# Patient Record
Sex: Male | Born: 1995 | Race: Black or African American | Hispanic: No | Marital: Single | Smoking: Never smoker
Health system: Southern US, Community
[De-identification: ages and names within clinical notes are randomized; demographics above are authoritative.]

---
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
## 2015-02-08 ENCOUNTER — Encounter: Payer: Self-pay | Admitting: *Deleted

## 2015-02-08 ENCOUNTER — Emergency Department
Admission: EM | Admit: 2015-02-08 | Discharge: 2015-02-08 | Disposition: A | Discharge status: Home / Self Care | Payer: BLUE CROSS/BLUE SHIELD | Attending: Emergency Medicine | Admitting: Emergency Medicine

## 2015-02-08 DIAGNOSIS — A084 Viral intestinal infection, unspecified: Secondary | ICD-10-CM | POA: Diagnosis not present

## 2015-02-08 DIAGNOSIS — R109 Unspecified abdominal pain: Secondary | ICD-10-CM | POA: Diagnosis present

## 2015-02-08 LAB — CBC WITH DIFFERENTIAL/PLATELET
BASOS ABS: 0 10*3/uL (ref 0–0.1)
Basophils Relative: 0 %
EOS PCT: 0 %
Eosinophils Absolute: 0 10*3/uL (ref 0–0.7)
HEMATOCRIT: 47.5 % (ref 40.0–52.0)
Hemoglobin: 15.9 g/dL (ref 13.0–18.0)
LYMPHS ABS: 0.4 10*3/uL — AB (ref 1.0–3.6)
LYMPHS PCT: 3 %
MCH: 27.1 pg (ref 26.0–34.0)
MCHC: 33.6 g/dL (ref 32.0–36.0)
MCV: 80.7 fL (ref 80.0–100.0)
MONO ABS: 0.3 10*3/uL (ref 0.2–1.0)
Monocytes Relative: 2 %
NEUTROS ABS: 13.4 10*3/uL — AB (ref 1.4–6.5)
Neutrophils Relative %: 95 %
PLATELETS: 262 10*3/uL (ref 150–440)
RBC: 5.88 MIL/uL (ref 4.40–5.90)
RDW: 13.8 % (ref 11.5–14.5)
WBC: 14.2 10*3/uL — ABNORMAL HIGH (ref 3.8–10.6)

## 2015-02-08 LAB — BASIC METABOLIC PANEL
ANION GAP: 10 (ref 5–15)
BUN: 12 mg/dL (ref 6–20)
CO2: 25 mmol/L (ref 22–32)
Calcium: 9.7 mg/dL (ref 8.9–10.3)
Chloride: 104 mmol/L (ref 101–111)
Creatinine, Ser: 0.77 mg/dL (ref 0.61–1.24)
GFR calc Af Amer: >60 mL/min (ref >60)
GFR calc non Af Amer: >60 mL/min (ref >60)
GLUCOSE: 101 mg/dL — AB (ref 65–99)
POTASSIUM: 4 mmol/L (ref 3.5–5.1)
Sodium: 139 mmol/L (ref 135–145)

## 2015-02-08 LAB — LIPASE, BLOOD: LIPASE: 18 U/L (ref 11–51)

## 2015-02-08 LAB — HEPATIC FUNCTION PANEL
ALBUMIN: 4.8 g/dL (ref 3.5–5.0)
ALT: 22 U/L (ref 17–63)
AST: 19 U/L (ref 15–41)
Alkaline Phosphatase: 81 U/L (ref 38–126)
BILIRUBIN TOTAL: 0.6 mg/dL (ref 0.3–1.2)
Bilirubin, Direct: <0.1 mg/dL — ABNORMAL LOW (ref 0.1–0.5)
Total Protein: 8.2 g/dL — ABNORMAL HIGH (ref 6.5–8.1)

## 2015-02-08 LAB — URINALYSIS COMPLETE WITH MICROSCOPIC (ARMC ONLY)
BACTERIA UA: NONE SEEN
BILIRUBIN URINE: NEGATIVE
Glucose, UA: NEGATIVE mg/dL
HGB URINE DIPSTICK: NEGATIVE
LEUKOCYTES UA: NEGATIVE
Nitrite: NEGATIVE
PH: 7 (ref 5.0–8.0)
PROTEIN: NEGATIVE mg/dL
RBC / HPF: NONE SEEN RBC/hpf (ref 0–5)
SQUAMOUS EPITHELIAL / LPF: NONE SEEN
Specific Gravity, Urine: 1.027 (ref 1.005–1.030)

## 2015-02-08 MED ORDER — ONDANSETRON HCL 4 MG/2ML IJ SOLN
4.0000 mg | Freq: Once | INTRAMUSCULAR | Status: AC
  Administered 2015-02-08: 4 mg via INTRAVENOUS
  Filled 2015-02-08: qty 2

## 2015-02-08 MED ORDER — SODIUM CHLORIDE 0.9 % IV BOLUS (SEPSIS)
1000.0000 mL | INTRAVENOUS | Status: AC
  Administered 2015-02-08: 1000 mL via INTRAVENOUS

## 2015-02-08 MED ORDER — ONDANSETRON HCL 4 MG PO TABS
ORAL_TABLET | ORAL | Status: DC
Start: 2015-02-08 — End: 2019-07-22

## 2015-02-08 NOTE — ED Notes (Signed)
Pt was provided ginger ale per request for PO Challenge.

## 2015-02-08 NOTE — ED Notes (Addendum)
Pt states vomited 2x today with the 1st occurrence containing dark red blood and numerous occurrences of diarrhea. Pt states he has had some lightheadedness.

## 2015-02-08 NOTE — Discharge Instructions (Signed)
We believe your symptoms are caused by either a viral infection or possible a bad food exposure.  Either way, since your symptoms have improved, we feel it is safe for you to go home and follow up with your regular doctor.  Please read the included information and stick to a bland diet for the next two days.  Drink plenty of clear fluids, and if you were provided with a prescription, please take it according to the label instructions.   ° °If you develop any new or worsening symptoms, including persistent vomiting not controlled with medication, fever greater than 101, severe or worsening abdominal pain, or other symptoms that concern you, please return immediately to the Emergency Department. ° °Viral Gastroenteritis  °Viral gastroenteritis is also known as stomach flu. This condition affects the stomach and intestinal tract. It can cause sudden diarrhea and vomiting. The illness typically lasts 3 to 8 days. Most people develop an immune response that eventually gets rid of the virus. While this natural response develops, the virus can make you quite ill.  °CAUSES  °Many different viruses can cause gastroenteritis, such as rotavirus or noroviruses. You can catch one of these viruses by consuming contaminated food or water. You may also catch a virus by sharing utensils or other personal items with an infected person or by touching a contaminated surface.  °SYMPTOMS  °The most common symptoms are diarrhea and vomiting. These problems can cause a severe loss of body fluids (dehydration) and a body salt (electrolyte) imbalance. Other symptoms may include:  °Fever.  °Headache.  °Fatigue.  °Abdominal pain. °DIAGNOSIS  °Your caregiver can usually diagnose viral gastroenteritis based on your symptoms and a physical exam. A stool sample may also be taken to test for the presence of viruses or other infections.  °TREATMENT  °This illness typically goes away on its own. Treatments are aimed at rehydration. The most serious  cases of viral gastroenteritis involve vomiting so severely that you are not able to keep fluids down. In these cases, fluids must be given through an intravenous line (IV).  °HOME CARE INSTRUCTIONS  °Drink enough fluids to keep your urine clear or pale yellow. Drink small amounts of fluids frequently and increase the amounts as tolerated.  °Ask your caregiver for specific rehydration instructions.  °Avoid:  °Foods high in sugar.  °Alcohol.  °Carbonated drinks.  °Tobacco.  °Juice.  °Caffeine drinks.  °Extremely hot or cold fluids.  °Fatty, greasy foods.  °Too much intake of anything at one time.  °Dairy products until 24 to 48 hours after diarrhea stops. °You may consume probiotics. Probiotics are active cultures of beneficial bacteria. They may lessen the amount and number of diarrheal stools in adults. Probiotics can be found in yogurt with active cultures and in supplements.  °Wash your hands well to avoid spreading the virus.  °Only take over-the-counter or prescription medicines for pain, discomfort, or fever as directed by your caregiver. Do not give aspirin to children. Antidiarrheal medicines are not recommended.  °Ask your caregiver if you should continue to take your regular prescribed and over-the-counter medicines.  °Keep all follow-up appointments as directed by your caregiver. °SEEK IMMEDIATE MEDICAL CARE IF:  °You are unable to keep fluids down.  °You do not urinate at least once every 6 to 8 hours.  °You develop shortness of breath.  °You notice blood in your stool or vomit. This may look like coffee grounds.  °You have abdominal pain that increases or is concentrated in one small area (  localized).  °You have persistent vomiting or diarrhea.  °You have a fever.  °The patient is a child younger than 3 months, and he or she has a fever.  °The patient is a child older than 3 months, and he or she has a fever and persistent symptoms.  °The patient is a child older than 3 months, and he or she has a fever  and symptoms suddenly get worse.  °The patient is a baby, and he or she has no tears when crying. °MAKE SURE YOU:  °Understand these instructions.  °Will watch your condition.  °Will get help right away if you are not doing well or get worse. °This information is not intended to replace advice given to you by your health care provider. Make sure you discuss any questions you have with your health care provider.  °Document Released: 01/15/2005 Document Revised: 04/09/2011 Document Reviewed: 11/01/2010  °Elsevier Interactive Patient Education ©2016 [employer]  ° °

## 2015-02-08 NOTE — ED Notes (Signed)
Pt reports abd pain since this am.  Pt reports seeing blood in emesis today x 2.  Diarrhea greater than 5 times.  No blood seen in stools. Pt alert.

## 2015-02-08 NOTE — ED Provider Notes (Signed)
[facility] Emergency Department Provider Note  ____________________________________________  Time seen: Approximately 6:03 PM  I have reviewed the triage vital signs and the nursing notes.   HISTORY  Chief Complaint Abdominal Pain    HPI Douglas Moore is a 20 y.o. male with a past medical history significant only for childhood asthma which is since resolved to presents with numerous episodes of vomiting (at least 8-10 times) and multiple episodes of loose stool today.  It started acutely this morning and has been persistent.  He notes that 2 different times he has seen dark red blood within the emesis.  He has felt lightheaded after the numerous symptoms of vomiting and diarrhea.  He is complaining of generalized abdominal pain without any specific focus or point origination.  He denies fever/chills, difficulty breathing, chest pain.Scrub the symptoms as severe earlier but he feels better now than he did before.  He has had some lightheadedness.   No past medical history on file.  There are no active problems to display for this patient.   No past surgical history on file.  Current Outpatient Rx  Name  Route  Sig  Dispense  Refill  . albuterol (PROVENTIL HFA;VENTOLIN HFA) 108 (90 BASE) MCG/ACT inhaler   Inhalation   Inhale 2 puffs into the lungs every 6 (six) hours as needed. For shortness of breath.          Marland Kitchen. albuterol (PROVENTIL) (2.5 MG/3ML) 0.083% nebulizer solution   Nebulization   Take 2.5 mg by nebulization every 6 (six) hours as needed. For shortness of breath.          . ondansetron (ZOFRAN) 4 MG tablet      Take 1-2 tabs by mouth every 8 hours as needed for nausea/vomiting   30 tablet   0     Allergies Review of patient's allergies indicates no known allergies.  No family history on file.  Social History Social History  Substance Use Topics  . Smoking status: Never Smoker   . Smokeless tobacco: Not on file  .  Alcohol Use: Yes    Review of Systems Constitutional: No fever/chills Eyes: No visual changes. ENT: No sore throat. Cardiovascular: Denies chest pain. Respiratory: Denies shortness of breath. Gastrointestinal: Generalized abdominal pain with numerous episodes of vomiting and multiple episodes of diarrhea.  There was some hematemesis.  He did not observe any blood in his stool. Genitourinary: Negative for dysuria. Musculoskeletal: Negative for back pain. Skin: Negative for rash. Neurological: Negative for headaches, focal weakness or numbness.  10-point ROS otherwise negative.  ____________________________________________   PHYSICAL EXAM:  VITAL SIGNS: ED Triage Vitals  Enc Vitals Group     BP 02/08/15 1544 133/72 mmHg     Pulse Rate 02/08/15 1544 96     Resp 02/08/15 1544 20     Temp 02/08/15 1544 97.5 F (36.4 C)     Temp Source 02/08/15 1544 Oral     SpO2 02/08/15 1544 100 %     Weight 02/08/15 1544 189 lb (85.73 kg)     Height 02/08/15 1544 5\' 10"  (1.778 m)     Head Cir --      Peak Flow --      Pain Score 02/08/15 1550 8     Pain Loc --      Pain Edu? --      Excl. in GC? --     Constitutional: Alert and oriented. Well appearing and in no acute distress. Eyes: Conjunctivae  are normal. PERRL. EOMI. Head: Atraumatic. Nose: No congestion/rhinnorhea. Mouth/Throat: Mucous membranes are moist.  Oropharynx non-erythematous. Neck: No stridor.   Cardiovascular: Normal rate, regular rhythm. Grossly normal heart sounds.  Good peripheral circulation. Respiratory: Normal respiratory effort.  No retractions. Lungs CTAB. Gastrointestinal: Soft with generalized mild tenderness to palpation throughout his abdomen.  No specific tenderness at McBurney's point, negative Murphy's sign. Musculoskeletal: No lower extremity tenderness nor edema.  No joint effusions. Neurologic:  Normal speech and language. No gross focal neurologic deficits are appreciated.  Skin:  Skin is warm, dry  and intact. No rash noted.   ____________________________________________   LABS (all labs ordered are listed, but only abnormal results are displayed)  Labs Reviewed  CBC WITH DIFFERENTIAL/PLATELET - Abnormal; Notable for the following:    WBC 14.2 (*)    Neutro Abs 13.4 (*)    Lymphs Abs 0.4 (*)    All other components within normal limits  BASIC METABOLIC PANEL - Abnormal; Notable for the following:    Glucose, Bld 101 (*)    All other components within normal limits  HEPATIC FUNCTION PANEL - Abnormal; Notable for the following:    Total Protein 8.2 (*)    Bilirubin, Direct <0.1 (*)    All other components within normal limits  LIPASE, BLOOD  URINALYSIS COMPLETEWITH MICROSCOPIC ([facility] ONLY)   ____________________________________________  EKG  Not indicated ____________________________________________  RADIOLOGY   No results found.  ____________________________________________   PROCEDURES  Procedure(s) performed: None  Critical Care performed: No ____________________________________________   INITIAL IMPRESSION / ASSESSMENT AND PLAN / ED COURSE  Pertinent labs & imaging results that were available during my care of the patient were reviewed by me and considered in my medical decision making (see chart for details).  The patient is very well-appearing at this time, laughing and joking with me, in no acute distress, and has normal vital signs.  His heart rate is likely elevated from his baseline since he is a young and healthy male in general and he has some volume depletion from what is most likely a viral gastroenteritis.  I will give him 1 L of normal saline as well as some additional Zofran and do a by mouth challenge.  His abdominal tenderness is very mild and I do not believe it represents an intra-abdominal abnormality such as appendicitis or other acute emergency.  I discussed all this with the patient and his mother and we are going to proceed with IV  fluids, Zofran and reassessment.  ----------------------------------------- 7:54 PM on 02/08/2015 -----------------------------------------  Reassessed, patient was sleeping and resting comfortably.  He has some residual aching in his stomach but overall he feels much better.  He is tolerated ginger ale and not had any additional vomiting episodes and no additional diarrhea.  I reexamined his abdomen and he has no tenderness to palpation, no guarding, no rebound tenderness.  His mild leukocytosis of believe is attributable to a viral gastroenteritis.  I gave my usual and customary return precautions.    ____________________________________________  FINAL CLINICAL IMPRESSION(S) / ED DIAGNOSES  Final diagnoses:  Viral gastroenteritis      NEW MEDICATIONS STARTED DURING THIS VISIT:  New Prescriptions   ONDANSETRON (ZOFRAN) 4 MG TABLET    Take 1-2 tabs by mouth every 8 hours as needed for nausea/vomiting     Modified Medications   No medications on file     Discontinued Medications   No medications on file     Loleta Rose, MD 02/08/15 4098

## 2015-06-18 ENCOUNTER — Emergency Department
Admission: EM | Admit: 2015-06-18 | Discharge: 2015-06-18 | Disposition: A | Discharge status: Home / Self Care | Payer: BLUE CROSS/BLUE SHIELD | Attending: Emergency Medicine | Admitting: Emergency Medicine

## 2015-06-18 ENCOUNTER — Encounter: Payer: Self-pay | Admitting: Emergency Medicine

## 2015-06-18 DIAGNOSIS — Y9367 Activity, basketball: Secondary | ICD-10-CM | POA: Insufficient documentation

## 2015-06-18 DIAGNOSIS — Y999 Unspecified external cause status: Secondary | ICD-10-CM | POA: Diagnosis not present

## 2015-06-18 DIAGNOSIS — W2105XA Struck by basketball, initial encounter: Secondary | ICD-10-CM | POA: Insufficient documentation

## 2015-06-18 DIAGNOSIS — Y929 Unspecified place or not applicable: Secondary | ICD-10-CM | POA: Diagnosis not present

## 2015-06-18 DIAGNOSIS — S01111A Laceration without foreign body of right eyelid and periocular area, initial encounter: Secondary | ICD-10-CM | POA: Diagnosis present

## 2015-06-18 NOTE — ED Provider Notes (Signed)
[facility] Emergency Department Provider Note  ____________________________________________  Time seen: Approximately 9:03 AM  I have reviewed the triage vital signs and the nursing notes.   HISTORY  Chief Complaint Laceration    HPI Douglas Moore is a 20 y.o. male who presents to the ER for evaluation of laceration of the right elbow. Patient states that he was playing basketball approximately minutes prior to arrival when he got elbowed. Denies any loss of consciousness or head injury. Patient states bleeding controlled upon arrival.   History reviewed. No pertinent past medical history.  There are no active problems to display for this patient.   History reviewed. No pertinent past surgical history.  Current Outpatient Rx  Name  Route  Sig  Dispense  Refill  . albuterol (PROVENTIL HFA;VENTOLIN HFA) 108 (90 BASE) MCG/ACT inhaler   Inhalation   Inhale 2 puffs into the lungs every 6 (six) hours as needed. For shortness of breath.          Marland Kitchen albuterol (PROVENTIL) (2.5 MG/3ML) 0.083% nebulizer solution   Nebulization   Take 2.5 mg by nebulization every 6 (six) hours as needed. For shortness of breath.          . ondansetron (ZOFRAN) 4 MG tablet      Take 1-2 tabs by mouth every 8 hours as needed for nausea/vomiting   30 tablet   0     Allergies Review of patient's allergies indicates no known allergies.  History reviewed. No pertinent family history.  Social History Social History  Substance Use Topics  . Smoking status: Never Smoker   . Smokeless tobacco: None  . Alcohol Use: Yes    Review of Systems Constitutional: No fever/chills Eyes: No visual changes.Superficial laceration noted to the inferior of right eyebrow. ENT: No sore throat. Skin: Negative for rash. See above. Neurological: Negative for headaches, focal weakness or numbness.  10-point ROS otherwise  negative.  ____________________________________________   PHYSICAL EXAM:  VITAL SIGNS: ED Triage Vitals  Enc Vitals Group     BP 06/18/15 0844 143/75 mmHg     Pulse Rate 06/18/15 0844 83     Resp 06/18/15 0844 18     Temp 06/18/15 0844 98.2 F (36.8 C)     Temp Source 06/18/15 0844 Oral     SpO2 06/18/15 0844 96 %     Weight 06/18/15 0844 187 lb (84.823 kg)     Height 06/18/15 0844  (1.778 m)     Head Cir --      Peak Flow --      Pain Score 06/18/15 0845 1     Pain Loc --      Pain Edu? --      Excl. in GC? --     Constitutional: Alert and oriented. Well appearing and in no acute distress. Eyes: Conjunctivae are normal. PERRL.  Head: 1 cm superficial laceration to the superior right eye. Skin:  Skin is warm, dry and intact. No rash noted. Psychiatric: Mood and affect are normal. Speech and behavior are normal.  ____________________________________________   LABS (all labs ordered are listed, but only abnormal results are displayed)  Labs Reviewed - No data to display ____________________________________________  EKG   ____________________________________________  RADIOLOGY   ____________________________________________   PROCEDURES  Procedure(s) performed: yes  Dermabond placed in the superficial laceration above the right eye. Closed with Steri-Strips s  Critical Care performed: No  ____________________________________________   INITIAL IMPRESSION / ASSESSMENT AND PLAN /  ED COURSE  Pertinent labs & imaging results that were available during my care of the patient were reviewed by me and considered in my medical decision making (see chart for details).  Superficial laceration to the right eye. Wound cleaned and covered as noted above. He is to return to the ER as needed follow-up with PCP. No other complaints at this time. ____________________________________________   FINAL CLINICAL IMPRESSION(S) / ED DIAGNOSES  Final diagnoses:   Laceration of eye region, right, initial encounter     This chart was dictated using voice recognition software/Dragon. Despite best efforts to proofread, errors can occur which can change the meaning. Any change was purely unintentional.   Evangeline Dakinharles M Omere Marti, PA-C 06/18/15 1008  Arnaldo NatalPaul F Malinda, MD 06/18/15 [phone]

## 2015-06-18 NOTE — ED Notes (Signed)
Pt to ed with c/o laceration to right eyebrow.  Pt states he was hit while playing basketball about 20 min pta. Bleeding controlled at this time.

## 2015-06-18 NOTE — Discharge Instructions (Signed)
Stitches, Staples, or Adhesive Wound Closure  °Health care providers use stitches (sutures), staples, and certain glue (skin adhesives) to hold skin together while it heals (wound closure). You may need this treatment after you have surgery or if you cut your skin accidentally. These methods help your skin to heal more quickly and make it less likely that you will have a scar. A wound may take several months to heal completely.  °The type of wound you have determines when your wound gets closed. In most cases, the wound is closed as soon as possible (primary skin closure). Sometimes, closure is delayed so the wound can be cleaned and allowed to heal naturally. This reduces the chance of infection. Delayed closure may be needed if your wound:  °Is caused by a bite.  °Happened more than 6 hours ago.  °Involves loss of skin or the tissues under the skin.  °Has dirt or debris in it that cannot be removed.  °Is infected. °WHAT ARE THE DIFFERENT KINDS OF WOUND CLOSURES?  °There are many options for wound closure. The one that your health care provider uses depends on how deep and how large your wound is.  °Adhesive Glue  °To use this type of glue to close a wound, your health care provider holds the edges of the wound together and paints the glue on the surface of your skin. You may need more than one layer of glue. Then the wound may be covered with a light bandage (dressing).  °This type of skin closure may be used for small wounds that are not deep (superficial). Using glue for wound closure is less painful than other methods. It does not require a medicine that numbs the area (local anesthetic). This method also leaves nothing to be removed. Adhesive glue is often used for children and on facial wounds.  °Adhesive glue cannot be used for wounds that are deep, uneven, or bleeding. It is not used inside of a wound.  °Adhesive Strips  °These strips are made of sticky (adhesive), porous paper. They are applied across your  skin edges like a regular adhesive bandage. You leave them on until they fall off.  °Adhesive strips may be used to close very superficial wounds. They may also be used along with sutures to improve the closure of your skin edges.  °Sutures  °Sutures are the oldest method of wound closure. Sutures can be made from natural substances, such as silk, or from synthetic materials, such as nylon and steel. They can be made from a material that your body can break down as your wound heals (absorbable), or they can be made from a material that needs to be removed from your skin (nonabsorbable). They come in many different strengths and sizes.  °Your health care provider attaches the sutures to a steel needle on one end. Sutures can be passed through your skin, or through the tissues beneath your skin. Then they are tied and cut. Your skin edges may be closed in one continuous stitch or in separate stitches.  °Sutures are strong and can be used for all kinds of wounds. Absorbable sutures may be used to close tissues under the skin. The disadvantage of sutures is that they may cause skin reactions that lead to infection. Nonabsorbable sutures need to be removed.  °Staples  °When surgical staples are used to close a wound, the edges of your skin on both sides of the wound are brought close together. A staple is placed across the wound, and   an instrument secures the edges together. Staples are often used to close surgical cuts (incisions).  °Staples are faster to use than sutures, and they cause less skin reaction. Staples need to be removed using a tool that bends the staples away from your skin.  °HOW DO I CARE FOR MY WOUND CLOSURE?  °Take medicines only as directed by your health care provider.  °If you were prescribed an antibiotic medicine for your wound, finish it all even if you start to feel better.  °Use ointments or creams only as directed by your health care provider.  °Wash your hands with soap and water before and  after touching your wound.  °Do not soak your wound in water. Do not take baths, swim, or use a hot tub until your health care provider approves.  °Ask your health care provider when you can start showering. Cover your wound if directed by your health care provider.  °Do not take out your own sutures or staples.  °Do not pick at your wound. Picking can cause an infection.  °Keep all follow-up visits as directed by your health care provider. This is important. °HOW LONG WILL I HAVE MY WOUND CLOSURE?  °Leave adhesive glue on your skin until the glue peels away.  °Leave adhesive strips on your skin until the strips fall off.  °Absorbable sutures will dissolve within several days.  °Nonabsorbable sutures and staples must be removed. The location of the wound will determine how long they stay in. This can range from several days to a couple of weeks. °WHEN SHOULD I SEEK HELP FOR MY WOUND CLOSURE?  °Contact your health care provider if:  °You have a fever.  °You have chills.  °You have drainage, redness, swelling, or pain at your wound.  °There is a bad smell coming from your wound.  °The skin edges of your wound start to separate after your sutures have been removed.  °Your wound becomes thick, raised, and darker in color after your sutures come out (scarring). °This information is not intended to replace advice given to you by your health care provider. Make sure you discuss any questions you have with your health care provider.  °Document Released: 10/10/2000 Document Revised: 02/05/2014 Document Reviewed: 06/24/2013  °Elsevier Interactive Patient Education ©2016 [employer]  ° °

## 2016-07-18 ENCOUNTER — Encounter: Payer: Self-pay | Admitting: *Deleted

## 2016-07-18 ENCOUNTER — Emergency Department
Admission: EM | Admit: 2016-07-18 | Discharge: 2016-07-18 | Disposition: A | Discharge status: Home / Self Care | Payer: BLUE CROSS/BLUE SHIELD | Attending: Emergency Medicine | Admitting: Emergency Medicine

## 2016-07-18 DIAGNOSIS — Y9367 Activity, basketball: Secondary | ICD-10-CM | POA: Insufficient documentation

## 2016-07-18 DIAGNOSIS — W500XXA Accidental hit or strike by another person, initial encounter: Secondary | ICD-10-CM | POA: Diagnosis not present

## 2016-07-18 DIAGNOSIS — S01411A Laceration without foreign body of right cheek and temporomandibular area, initial encounter: Secondary | ICD-10-CM | POA: Diagnosis not present

## 2016-07-18 DIAGNOSIS — Y999 Unspecified external cause status: Secondary | ICD-10-CM | POA: Insufficient documentation

## 2016-07-18 DIAGNOSIS — Z23 Encounter for immunization: Secondary | ICD-10-CM | POA: Diagnosis not present

## 2016-07-18 DIAGNOSIS — S0993XA Unspecified injury of face, initial encounter: Secondary | ICD-10-CM | POA: Diagnosis present

## 2016-07-18 DIAGNOSIS — Y92310 Basketball court as the place of occurrence of the external cause: Secondary | ICD-10-CM | POA: Diagnosis not present

## 2016-07-18 DIAGNOSIS — S0181XA Laceration without foreign body of other part of head, initial encounter: Secondary | ICD-10-CM

## 2016-07-18 MED ORDER — TETANUS-DIPHTH-ACELL PERTUSSIS 5-2.5-18.5 LF-MCG/0.5 IM SUSP
0.5000 mL | Freq: Once | INTRAMUSCULAR | Status: AC
  Administered 2016-07-18: 0.5 mL via INTRAMUSCULAR
  Filled 2016-07-18: qty 0.5

## 2016-07-18 MED ORDER — LIDOCAINE HCL (PF) 1 % IJ SOLN
INTRAMUSCULAR | Status: AC
  Filled 2016-07-18: qty 5

## 2016-07-18 NOTE — ED Notes (Signed)
Pt reports that he was playing basketball and got hit in the right side of face (cheek bone area) with an elbow causing a laceration

## 2016-07-18 NOTE — ED Triage Notes (Signed)
Pt has laceration to right cheek.  Pt was elbowed playing basketball today.  No loc.  Bleeding controlled.

## 2016-07-18 NOTE — ED Provider Notes (Signed)
[facility] Emergency Department Provider Note  ____________________________________________  Time seen: Approximately 10:02 PM  I have reviewed the triage vital signs and the nursing notes.   HISTORY  Chief Complaint Laceration    HPI Douglas Moore is a 21 y.o. male that presents to the emergency department with facial laceration. Patient states that he was playing basketball when another player hit him in the cheek. He does not remember last tetanus shot. No additional injuries. He denies fever, shortness of breath, chest pain, nausea, vomiting, abdominal pain.   No past medical history on file.  There are no active problems to display for this patient.   No past surgical history on file.  Prior to Admission medications   Medication Sig Start Date End Date Taking? Authorizing Provider  albuterol (PROVENTIL HFA;VENTOLIN HFA) 108 (90 BASE) MCG/ACT inhaler Inhale 2 puffs into the lungs every 6 (six) hours as needed. For shortness of breath.     [provider]  albuterol (PROVENTIL) (2.5 MG/3ML) 0.083% nebulizer solution Take 2.5 mg by nebulization every 6 (six) hours as needed. For shortness of breath.     [provider]  ondansetron (ZOFRAN) 4 MG tablet Take 1-2 tabs by mouth every 8 hours as needed for nausea/vomiting 02/08/15   Loleta Rose, MD    Allergies Patient has no known allergies.  No family history on file.  Social History Social History  Substance Use Topics  . Smoking status: Never Smoker  . Smokeless tobacco: Never Used  . Alcohol use Yes     Review of Systems  Constitutional: No fever/chills Cardiovascular: No chest pain. Respiratory: No SOB. Gastrointestinal: No abdominal pain.  No nausea, no vomiting.  Musculoskeletal: Negative for musculoskeletal pain. Skin: Negative for rash, ecchymosis. Positive for laceration.   ____________________________________________   PHYSICAL EXAM:  VITAL  SIGNS: ED Triage Vitals [07/18/16 2104]  Enc Vitals Group     BP (!) 146/89     Pulse Rate (!) 107     Resp 18     Temp 98.4 F (36.9 C)     Temp Source Oral     SpO2 97 %     Weight 176 lb (79.8 kg)     Height 5\' 9"  (1.753 m)     Head Circumference      Peak Flow      Pain Score 7     Pain Loc      Pain Edu?      Excl. in GC?      Constitutional: Alert and oriented. Well appearing and in no acute distress. Eyes: Conjunctivae are normal. PERRL. EOMI. Head:  ENT:      Ears:      Nose: No congestion/rhinnorhea.      Mouth/Throat: Mucous membranes are moist.  Neck: No stridor.   Cardiovascular: Normal rate, regular rhythm.  Good peripheral circulation. Respiratory: Normal respiratory effort without tachypnea or retractions. Lungs CTAB. Good air entry to the bases with no decreased or absent breath sounds. Musculoskeletal: Full range of motion to all extremities. No gross deformities appreciated. Neurologic:  Normal speech and language. No gross focal neurologic deficits are appreciated.  Skin:  Skin is warm, dry. 1 cm shallow laceration to right cheek. Psychiatric: Mood and affect are normal. Speech and behavior are normal. Patient exhibits appropriate insight and judgement.   ____________________________________________   LABS (all labs ordered are listed, but only abnormal results are displayed)  Labs Reviewed - No data to display ____________________________________________  EKG   ____________________________________________  RADIOLOGY   No results found.  ____________________________________________    PROCEDURES  Procedure(s) performed:    Procedures  LACERATION REPAIR Performed by: Enid DerryAshley Deniyah Dillavou  Consent: Verbal consent obtained.  Consent given by: patient  Prepped and Draped in normal sterile fashion  Wound explored: No foreign bodies   Laceration Location: cheek  Laceration Length: 1cm  Anesthesia: None  Local anesthetic:  lidocaine 1% without epinephrine  Anesthetic total: 4 ml  Irrigation method: syringe  Amount of cleaning: 500ml normal saline  Skin closure: 6-0 nylon  Number of sutures: 3  Technique: Simple interrupted  Patient tolerance: Patient tolerated the procedure well with no immediate complications.  Medications  lidocaine (PF) (XYLOCAINE) 1 % injection (not administered)  Tdap (BOOSTRIX) injection 0.5 mL (0.5 mLs Intramuscular Given 07/18/16 2248)     ____________________________________________   INITIAL IMPRESSION / ASSESSMENT AND PLAN / ED COURSE  Pertinent labs & imaging results that were available during my care of the patient were reviewed by me and considered in my medical decision making (see chart for details).  Review of the [place] CSRS was performed in accordance of the [facility] prior to dispensing any controlled drugs.   Patient's diagnosis is consistent with facial laceration. Vital signs and exam are reassuring. Laceration was repaired with sutures. Laceration is shallow and clean and I do not expect this to get infected. Tetanus shot was updated. Patient is to follow up with PCP as directed. Patient is given ED precautions to return to the ED for any worsening or new symptoms.     ____________________________________________  FINAL CLINICAL IMPRESSION(S) / ED DIAGNOSES  Final diagnoses:  Facial laceration, initial encounter      NEW MEDICATIONS STARTED DURING THIS VISIT:  Discharge Medication List as of 07/18/2016 11:09 PM          This chart was dictated using voice recognition software/Dragon. Despite best efforts to proofread, errors can occur which can change the meaning. Any change was purely unintentional.    Enid DerryWagner, Blaine Guiffre, PA-C 07/18/16 2330    Minna AntisPaduchowski, Kevin, MD 07/19/16 0000

## 2017-09-18 ENCOUNTER — Other Ambulatory Visit: Payer: Self-pay

## 2017-09-18 ENCOUNTER — Encounter: Payer: Self-pay | Admitting: Emergency Medicine

## 2017-09-18 ENCOUNTER — Emergency Department
Admission: EM | Admit: 2017-09-18 | Discharge: 2017-09-18 | Disposition: A | Discharge status: Home / Self Care | Payer: 59 | Attending: Emergency Medicine | Admitting: Emergency Medicine

## 2017-09-18 ENCOUNTER — Emergency Department: Payer: 59

## 2017-09-18 DIAGNOSIS — T148XXA Other injury of unspecified body region, initial encounter: Secondary | ICD-10-CM

## 2017-09-18 DIAGNOSIS — S92901A Unspecified fracture of right foot, initial encounter for closed fracture: Secondary | ICD-10-CM | POA: Diagnosis not present

## 2017-09-18 DIAGNOSIS — X501XXA Overexertion from prolonged static or awkward postures, initial encounter: Secondary | ICD-10-CM | POA: Diagnosis not present

## 2017-09-18 DIAGNOSIS — Z79899 Other long term (current) drug therapy: Secondary | ICD-10-CM | POA: Insufficient documentation

## 2017-09-18 DIAGNOSIS — Y999 Unspecified external cause status: Secondary | ICD-10-CM | POA: Insufficient documentation

## 2017-09-18 DIAGNOSIS — Y929 Unspecified place or not applicable: Secondary | ICD-10-CM | POA: Diagnosis not present

## 2017-09-18 DIAGNOSIS — M79671 Pain in right foot: Secondary | ICD-10-CM | POA: Diagnosis not present

## 2017-09-18 DIAGNOSIS — S99921A Unspecified injury of right foot, initial encounter: Secondary | ICD-10-CM | POA: Insufficient documentation

## 2017-09-18 DIAGNOSIS — Y9367 Activity, basketball: Secondary | ICD-10-CM | POA: Insufficient documentation

## 2017-09-18 MED ORDER — KETOROLAC TROMETHAMINE 60 MG/2ML IM SOLN
60.0000 mg | Freq: Once | INTRAMUSCULAR | Status: DC
  Filled 2017-09-18: qty 2

## 2017-09-18 MED ORDER — TRAMADOL HCL 50 MG PO TABS
50.0000 mg | ORAL_TABLET | Freq: Once | ORAL | Status: AC
  Administered 2017-09-18: 50 mg via ORAL
  Filled 2017-09-18: qty 1

## 2017-09-18 MED ORDER — TRAMADOL HCL 50 MG PO TABS: 50.0000 mg | ORAL_TABLET | Freq: Four times a day (QID) | ORAL | 0 refills | Status: AC | PRN

## 2017-09-18 NOTE — ED Provider Notes (Signed)
[facility] Emergency Department Provider Note   ____________________________________________   First MD Initiated Contact with Patient 09/18/17 [phone]     (approximate)  I have reviewed the triage vital signs and the nursing notes.   HISTORY  Chief Complaint Foot Injury    HPI Douglas Moore is a 22 y.o. male who comes into the hospital today with some right foot pain.  The patient was playing basketball and states that he came down hard on his right foot.  He reports that he has broken his foot before so he was concerned.  This occurred around 730.  He did not take any medication for pain but he did ice it.  He rates his pain at 8 out of 10 in intensity currently.  The patient states that he has some swelling to his right ankle as well the patient also states that his great toe and second toe feel numb.   History reviewed. No pertinent past medical history.  There are no active problems to display for this patient.   History reviewed. No pertinent surgical history.  Prior to Admission medications   Medication Sig Start Date End Date Taking? Authorizing Provider  albuterol (PROVENTIL HFA;VENTOLIN HFA) 108 (90 BASE) MCG/ACT inhaler Inhale 2 puffs into the lungs every 6 (six) hours as needed. For shortness of breath.     [provider]  albuterol (PROVENTIL) (2.5 MG/3ML) 0.083% nebulizer solution Take 2.5 mg by nebulization every 6 (six) hours as needed. For shortness of breath.     [provider]  ondansetron (ZOFRAN) 4 MG tablet Take 1-2 tabs by mouth every 8 hours as needed for nausea/vomiting 02/08/15   Loleta RoseForbach, Cory, MD  traMADol (ULTRAM) 50 MG tablet Take 1 tablet (50 mg total) by mouth every 6 (six) hours as needed. 09/18/17   Rebecka ApleyWebster, Jason Frisbee P, MD    Allergies Patient has no known allergies.  No family history on file.  Social History Social History   Tobacco Use  . Smoking status: Never Smoker  . Smokeless  tobacco: Never Used  Substance Use Topics  . Alcohol use: Yes  . Drug use: No    Review of Systems  Constitutional: No fever/chills Eyes: No visual changes. ENT: No sore throat. Cardiovascular: Denies chest pain. Respiratory: Denies shortness of breath. Gastrointestinal: No abdominal pain.  No nausea, no vomiting.  No diarrhea.  No constipation. Genitourinary: Negative for dysuria. Musculoskeletal: right foot pain Skin: Negative for rash. Neurological: Negative for headaches, focal weakness or numbness.   ____________________________________________   PHYSICAL EXAM:  VITAL SIGNS: ED Triage Vitals  Enc Vitals Group     BP 09/18/17 0132 135/75     Pulse Rate 09/18/17 0132 80     Resp 09/18/17 0132 18     Temp 09/18/17 0132 97.9 F (36.6 C)     Temp Source 09/18/17 0132 Oral     SpO2 09/18/17 0132 100 %     Weight 09/18/17 0131 182 lb (82.6 kg)     Height 09/18/17 0131 5\' 11"  (1.803 m)     Head Circumference --      Peak Flow --      Pain Score 09/18/17 0131 9     Pain Loc --      Pain Edu? --      Excl. in GC? --     Constitutional: Alert and oriented. Well appearing and in mild distress. Eyes: Conjunctivae are normal. PERRL. EOMI. Head: Atraumatic. Nose: No congestion/rhinnorhea.  Mouth/Throat: Mucous membranes are moist.  Oropharynx non-erythematous. Cardiovascular: Normal rate, regular rhythm. Grossly normal heart sounds.  Good peripheral circulation. Respiratory: Normal respiratory effort.  No retractions. Lungs CTAB. Gastrointestinal: Soft and nontender. No distention. Positive bowel sounds Musculoskeletal: Soft tissue swelling to right foot laterally midfoot with some point tenderness.  Pain with flexion, dorsiflexion, eversion and inversion.  Mild numbness to right great toe and second toe, sensation intact to third fourth and fifth digit. Neurologic:  Normal speech and language.  Skin:  Skin is warm, dry and intact.  Psychiatric: Mood and affect are  normal.   ____________________________________________   LABS (all labs ordered are listed, but only abnormal results are displayed)  Labs Reviewed - No data to display ____________________________________________  EKG  none ____________________________________________  RADIOLOGY  ED MD interpretation: DG right foot: Small osseous density lateral to the cuboid and anterior calcaneus is indeterminate for avulsion injury versus accessory ossicle.  Correlate with point tenderness.  Official radiology report(s): Dg Foot Complete Right  Result Date: 09/18/2017 CLINICAL DATA:  Right foot pain after injury playing basketball tonight. EXAM: RIGHT FOOT COMPLETE - 3+ VIEW COMPARISON:  Radiograph 06/15/2017 FINDINGS: Small osseous density lateral to the cuboid and anterior calcaneus. Unclear if this represents an avulsion injury or accessory ossicle. Otherwise no fracture of the foot. There is no evidence of arthropathy or other focal bone abnormality. Possible lateral soft tissue edema. IMPRESSION: Small osseous density lateral to the cuboid and anterior calcaneus is indeterminate for avulsion injury versus accessory ossicle. Recommend correlation with point tenderness. Electronically Signed   By: Rubye OaksMelanie  Ehinger M.D.   On: 09/18/2017 02:16    ____________________________________________   PROCEDURES  Procedure(s) performed: None  Procedures  Critical Care performed: No  ____________________________________________   INITIAL IMPRESSION / ASSESSMENT AND PLAN / ED COURSE  As part of my medical decision making, I reviewed the following data within the electronic MEDICAL RECORD NUMBER Notes from prior ED visits and [place] Controlled Substance Database   This is a 22 year old male who comes into the hospital today after injuring his foot while playing basketball.  We did send the patient for an x-ray and he has a possible avulsion injury.  I did give the patient some tramadol but the patient  did not want the Toradol.  He was placed in a splint and given crutches.  The patient's sensation remained the same after being placed in the splint.  He will be discharged to follow-up with orthopedic surgery.  He states that he needs a boot so that he can work.      ____________________________________________   FINAL CLINICAL IMPRESSION(S) / ED DIAGNOSES  Final diagnoses:  Injury of right foot, initial encounter  Avulsion fracture     ED Discharge Orders         Ordered    traMADol (ULTRAM) 50 MG tablet  Every 6 hours PRN     09/18/17 0315           Note:  This document was prepared using Dragon voice recognition software and may include unintentional dictation errors.    Rebecka ApleyWebster, Javon Hupfer P, MD 09/18/17 [id number]

## 2017-09-18 NOTE — Discharge Instructions (Addendum)
Please follow up with orthopedic surgery for further evaluation and treatment of your injury.

## 2017-09-18 NOTE — ED Triage Notes (Signed)
Patient ambulatory to triage with steady gait, without difficulty or distress noted; pt reports injuring right foot while playing bball tonight (previous injury of same)

## 2017-09-19 DIAGNOSIS — S92214A Nondisplaced fracture of cuboid bone of right foot, initial encounter for closed fracture: Secondary | ICD-10-CM | POA: Diagnosis not present

## 2018-06-05 DIAGNOSIS — S62316A Displaced fracture of base of fifth metacarpal bone, right hand, initial encounter for closed fracture: Secondary | ICD-10-CM | POA: Diagnosis not present

## 2018-08-13 ENCOUNTER — Telehealth: Payer: Self-pay

## 2018-08-13 NOTE — Telephone Encounter (Signed)
PER INSURANCE GAP IN CARE. NOT OUR PATIENT.

## 2019-03-23 ENCOUNTER — Encounter (HOSPITAL_COMMUNITY): Payer: Self-pay | Admitting: Emergency Medicine

## 2019-03-23 ENCOUNTER — Emergency Department (HOSPITAL_COMMUNITY)
Admission: EM | Admit: 2019-03-23 | Discharge: 2019-03-23 | Disposition: A | Discharge status: Home / Self Care | Payer: 59 | Attending: Emergency Medicine | Admitting: Emergency Medicine

## 2019-03-23 ENCOUNTER — Emergency Department (HOSPITAL_COMMUNITY): Payer: 59

## 2019-03-23 ENCOUNTER — Other Ambulatory Visit: Payer: Self-pay

## 2019-03-23 DIAGNOSIS — R1110 Vomiting, unspecified: Secondary | ICD-10-CM | POA: Insufficient documentation

## 2019-03-23 DIAGNOSIS — K644 Residual hemorrhoidal skin tags: Secondary | ICD-10-CM | POA: Diagnosis not present

## 2019-03-23 DIAGNOSIS — R1084 Generalized abdominal pain: Secondary | ICD-10-CM | POA: Diagnosis not present

## 2019-03-23 LAB — COMPREHENSIVE METABOLIC PANEL
ALT: 12 U/L (ref 0–44)
AST: 15 U/L (ref 15–41)
Albumin: 4.3 g/dL (ref 3.5–5.0)
Alkaline Phosphatase: 48 U/L (ref 38–126)
Anion gap: 11 (ref 5–15)
BUN: 8 mg/dL (ref 6–20)
CO2: 23 mmol/L (ref 22–32)
Calcium: 9 mg/dL (ref 8.9–10.3)
Chloride: 107 mmol/L (ref 98–111)
Creatinine, Ser: 0.98 mg/dL (ref 0.61–1.24)
GFR calc Af Amer: >60 mL/min (ref >60)
GFR calc non Af Amer: >60 mL/min (ref >60)
Glucose, Bld: 122 mg/dL — ABNORMAL HIGH (ref 70–99)
Potassium: 3.9 mmol/L (ref 3.5–5.1)
Sodium: 141 mmol/L (ref 135–145)
Total Bilirubin: 0.8 mg/dL (ref 0.3–1.2)
Total Protein: 7 g/dL (ref 6.5–8.1)

## 2019-03-23 LAB — CBC
HCT: 42.5 % (ref 39.0–52.0)
Hemoglobin: 14.8 g/dL (ref 13.0–17.0)
MCH: 29.2 pg (ref 26.0–34.0)
MCHC: 34.8 g/dL (ref 30.0–36.0)
MCV: 84 fL (ref 80.0–100.0)
Platelets: 256 10*3/uL (ref 150–400)
RBC: 5.06 MIL/uL (ref 4.22–5.81)
RDW: 13.2 % (ref 11.5–15.5)
WBC: 13.4 10*3/uL — ABNORMAL HIGH (ref 4.0–10.5)
nRBC: 0 % (ref 0.0–0.2)

## 2019-03-23 LAB — POC OCCULT BLOOD, ED: Fecal Occult Bld: NEGATIVE

## 2019-03-23 LAB — TYPE AND SCREEN
ABO/RH(D): A POS
Antibody Screen: NEGATIVE

## 2019-03-23 LAB — ABO/RH: ABO/RH(D): A POS

## 2019-03-23 MED ORDER — MORPHINE SULFATE (PF) 4 MG/ML IV SOLN
4.0000 mg | Freq: Once | INTRAVENOUS | Status: AC
  Administered 2019-03-23: 4 mg via INTRAVENOUS
  Filled 2019-03-23: qty 1

## 2019-03-23 MED ORDER — IOHEXOL 300 MG/ML  SOLN
100.0000 mL | Freq: Once | INTRAMUSCULAR | Status: AC | PRN
  Administered 2019-03-23: 100 mL via INTRAVENOUS

## 2019-03-23 MED ORDER — SODIUM CHLORIDE 0.9 % IV BOLUS
1000.0000 mL | Freq: Once | INTRAVENOUS | Status: AC
  Administered 2019-03-23: 1000 mL via INTRAVENOUS

## 2019-03-23 MED ORDER — ONDANSETRON 4 MG PO TBDP: 4.0000 mg | ORAL_TABLET | Freq: Three times a day (TID) | ORAL | 0 refills | Status: DC | PRN

## 2019-03-23 MED ORDER — ONDANSETRON HCL 4 MG/2ML IJ SOLN
4.0000 mg | Freq: Once | INTRAMUSCULAR | Status: AC
  Administered 2019-03-23: 4 mg via INTRAVENOUS
  Filled 2019-03-23: qty 2

## 2019-03-23 NOTE — ED Provider Notes (Signed)
[facility] EMERGENCY DEPARTMENT Provider Note   CSN: [id number] Arrival date & time: 03/23/19  1105     History Chief Complaint  Patient presents with  . Abdominal Pain  . Emesis  . Rectal Bleeding    Douglas Moore is a 24 y.o. male with no known past medical history presents to the emergency department today with chief complaint of acute onset of generalized abdominal pain x1 day. He describes the pain as strong aching sensation located throughout entire abdomen. He endorses nausea, vomiting and approximately 15 episodes of non bloody emesis. He had an episode of diarrhea with bright red blood just prior to arrival, denies history of the same.   He did not take any medications for his symptoms prior to arrival. He denies fever, chills, chest pain, shortness of breath, gross hematuria, urinary frequency, testicular pain. No recent antibiotic use, suspicious food intake or recent travel. He denies recent alcohol intake, does endorse smoking marijuana, no other drug use.   No past medical history on file.  There are no problems to display for this patient.   No past surgical history on file.     No family history on file.  Social History   Tobacco Use  . Smoking status: Never Smoker  . Smokeless tobacco: Never Used  Substance Use Topics  . Alcohol use: Yes  . Drug use: Yes    Types: Marijuana    Home Medications Prior to Admission medications   Medication Sig Start Date End Date Taking? Authorizing Provider  albuterol (PROVENTIL HFA;VENTOLIN HFA) 108 (90 BASE) MCG/ACT inhaler Inhale 2 puffs into the lungs every 6 (six) hours as needed. For shortness of breath.     [provider]  albuterol (PROVENTIL) (2.5 MG/3ML) 0.083% nebulizer solution Take 2.5 mg by nebulization every 6 (six) hours as needed. For shortness of breath.     [provider]  ondansetron (ZOFRAN) 4 MG tablet Take 1-2 tabs by mouth every 8 hours as needed for  nausea/vomiting 02/08/15   Loleta Rose, MD  traMADol (ULTRAM) 50 MG tablet Take 1 tablet (50 mg total) by mouth every 6 (six) hours as needed. 09/18/17   Rebecka Apley, MD    Allergies    Patient has no known allergies.  Review of Systems   Review of Systems  All other systems are reviewed and are negative for acute change except as noted in the HPI.   Physical Exam Updated Vital Signs BP (!) 142/67 (BP Location: Left Arm)   Pulse 65   Temp (!) 97.4 F (36.3 C) (Oral)   Resp (!) 22   Ht 5\' 9"  (1.753 m)   Wt 79.8 kg   SpO2 97%   BMI 25.99 kg/m   Physical Exam Vitals and nursing note reviewed.  Constitutional:      General: He is in acute distress.     Appearance: He is not diaphoretic.     Comments: Patient looks uncomfortable, he is actively vomiting during exam  HENT:     Head: Normocephalic and atraumatic.     Right Ear: Tympanic membrane and external ear normal.     Left Ear: Tympanic membrane and external ear normal.     Nose: Nose normal.     Mouth/Throat:     Mouth: Mucous membranes are moist.     Pharynx: Oropharynx is clear.  Eyes:     General: No scleral icterus.       Right eye: No discharge.  Left eye: No discharge.     Extraocular Movements: Extraocular movements intact.     Conjunctiva/sclera: Conjunctivae normal.     Pupils: Pupils are equal, round, and reactive to light.  Neck:     Vascular: No JVD.  Cardiovascular:     Rate and Rhythm: Normal rate and regular rhythm.     Pulses: Normal pulses.          Radial pulses are 2+ on the right side and 2+ on the left side.     Heart sounds: Normal heart sounds.  Pulmonary:     Comments: Lungs clear to auscultation in all fields. Symmetric chest rise. No wheezing, rales, or rhonchi. Abdominal:     Comments: Abdomen is soft, non-distended, with generalized tenderness and voluntary guarding.  No rigidity, no peritoneal signs.  No CVA tenderness.  Genitourinary:    Comments: Loss adjuster, chartered  present for exam. Digital Rectal Exam reveals sphincter with good tone. Small external hemorrhoid in 4'o clock position without evidence of thrombosis. No masses or fissures. Stool color is brown with no overt blood. No gross melena.  Musculoskeletal:        General: Normal range of motion.     Cervical back: Normal range of motion.  Skin:    General: Skin is warm and dry.     Capillary Refill: Capillary refill takes less than 2 seconds.  Neurological:     Mental Status: He is oriented to person, place, and time.     GCS: GCS eye subscore is 4. GCS verbal subscore is 5. GCS motor subscore is 6.     Comments: Fluent speech, no facial droop.  Psychiatric:        Behavior: Behavior normal.       ED Results / Procedures / Treatments   Labs (all labs ordered are listed, but only abnormal results are displayed) Labs Reviewed  COMPREHENSIVE METABOLIC PANEL - Abnormal; Notable for the following components:      Result Value   Glucose, Bld 122 (*)    All other components within normal limits  CBC - Abnormal; Notable for the following components:   WBC 13.4 (*)    All other components within normal limits  POC OCCULT BLOOD, ED  TYPE AND SCREEN  ABO/RH    EKG None  Radiology CT ABDOMEN PELVIS W CONTRAST  Result Date: 03/23/2019 CLINICAL DATA:  Acute generalized abdominal pain, vomiting. EXAM: CT ABDOMEN AND PELVIS WITH CONTRAST TECHNIQUE: Multidetector CT imaging of the abdomen and pelvis was performed using the standard protocol following bolus administration of intravenous contrast. CONTRAST:  OMNIPAQUE IOHEXOL 300 MG/ML  SOLN COMPARISON:  None. FINDINGS: Lower chest: No acute abnormality. Hepatobiliary: No focal liver abnormality is seen. No gallstones, gallbladder wall thickening, or biliary dilatation. Pancreas: Unremarkable. No pancreatic ductal dilatation or surrounding inflammatory changes. Spleen: Normal in size without focal abnormality. Adrenals/Urinary Tract: Adrenal  glands are unremarkable. Kidneys are normal, without renal calculi, focal lesion, or hydronephrosis. Bladder is unremarkable. Stomach/Bowel: Stomach is within normal limits. Appendix appears normal. No evidence of bowel wall thickening, distention, or inflammatory changes. Vascular/Lymphatic: No significant vascular findings are present. No enlarged abdominal or pelvic lymph nodes. Reproductive: Prostate is unremarkable. Other: No abdominal wall hernia or abnormality. No abdominopelvic ascites. Musculoskeletal: No acute or significant osseous findings. IMPRESSION: No definite abnormality seen in the abdomen or pelvis. Electronically Signed   By: Lupita Raider M.D.   On: 03/23/2019 13:21    Procedures Procedures (including critical care  time)  Medications Ordered in ED Medications  morphine 4 MG/ML injection 4 mg (4 mg Intravenous Given 03/23/19 1147)  ondansetron (ZOFRAN) injection 4 mg (4 mg Intravenous Given 03/23/19 1147)  sodium chloride 0.9 % bolus 1,000 mL (0 mLs Intravenous Stopped 03/23/19 1241)  iohexol (OMNIPAQUE) 300 MG/ML solution 100 mL (100 mLs Intravenous Contrast Given 03/23/19 1313)    ED Course  I have reviewed the triage vital signs and the nursing notes.  Pertinent labs & imaging results that were available during my care of the patient were reviewed by me and considered in my medical decision making (see chart for details).    MDM Rules/Calculators/A&P                      Patient presents to the ED with complaints of abdominal pain. Patient nontoxic appearing, in no apparent distress, vitals WNL.  He does appear uncomfortable and is actively vomiting during exam.  He has generalized abdominal tenderness that appears worse in RLQ without peritoneal signs.  Labs ordered in triage including type and screen given patient's complaint of blood in stool.  Patient's history is suggestive of hemorrhoids, will do exam to evaluate. Will also CT A/P given how tender he is on exam.  Analgesics, anti-emetics, and fluids administered.   Labs reviewed and grossly unremarkable. Non specific leukocytosis of 13.4, no anemia, no significant electrolyte derangements. LFTs, renal function, and lipase WNL.  Fecal occult negative.  On rectal exam patient has a small external hemorrhoid without evidence of thrombosis, suspect this was the cause of his bright red blood in stool.  No blood seen on exam. Imaging without acute findings.  On repeat abdominal exam patient remains without peritoneal signs, doubt cholecystitis, pancreatitis, diverticulitis, appendicitis, bowel obstruction/perforation. Patient tolerating PO in the emergency department. Will discharge home with supportive measures including zofran. Discussed at length constipation and OTC treatments including OTC miralax and colace. I discussed results, treatment plan, need for PCP follow-up, and return precautions with the patient. Provided opportunity for questions, patient confirmed understanding and is in agreement with plan.    Portions of this note were generated with [employer]. Dictation errors may occur despite best attempts at proofreading.    Final Clinical Impression(s) / ED Diagnoses Final diagnoses:  Generalized abdominal pain  External hemorrhoid    Rx / DC Orders ED Discharge Orders    None       Cherre Robins, PA-C 03/23/19 1557    Blanchie Dessert, MD 03/24/19 [phone]

## 2019-03-23 NOTE — Discharge Instructions (Addendum)
The CT scan did not show any signs of infection in your belly.   --Prescription sent to your pharmacy for zofran. This is a medicine for nausea, please take as prescribed and if needed  -Follow Up:  Please follow up with primary care provider by scheduling an appointment as soon as possible for a visit  If you do not have a primary care physician, contact [facility] at [phone] for referral   To help reduce constipation and promote bowel health, 1. Drink at least 64 ounces of water each day; 2. Eat plenty of fiber (fruits, vegetables, whole grains, legumes) 3. Get plenty of physical activity  If needed, you may also use daily or as needed, MiraLax (Osmotic Laxative) up to  1-2 times a day and Colace (Stool Softener aka Docusate) 100 mg up to twice a day to help with bowel movements. These medications are over the counter.  MiraLax is an Osmotic You may use other over-the-counter medications such as Dulcolax, Fleet enemas, magnesium citrate as needed for constipation. Please note that some of these medications may cause you to have abdominal cramping which is normal. If you develop severe abdominal pain, fever, vomiting, distention of your abdomen, unable to have a bowel movement for 5 days or are not passing gas, please return to the hospital.  Return to the Emergency Department for any fever, worsening pain, blood in stool, severe abdominal pain, or any other worsening or concerning symptoms.

## 2019-03-23 NOTE — ED Notes (Signed)
Patient verbalizes understanding of discharge instructions . Opportunity for questions and answers were provided . Armband removed by staff ,Pt discharged from ED. W/C  offered at D/C  and Declined W/C at D/C and was escorted to lobby by RN.  

## 2019-03-23 NOTE — ED Notes (Signed)
Pt at the door of his room asking for a drink.

## 2019-03-23 NOTE — ED Triage Notes (Addendum)
Pt arrives via POV from home with generalized abdominal pain that began last night. Denies recents fevers, sick contacts. Noted today to have blood in stool. Vomiting in triage.

## 2019-07-22 ENCOUNTER — Other Ambulatory Visit: Payer: Self-pay

## 2019-07-22 ENCOUNTER — Emergency Department (HOSPITAL_COMMUNITY)
Admission: EM | Admit: 2019-07-22 | Discharge: 2019-07-22 | Disposition: A | Discharge status: Home / Self Care | Payer: 59 | Attending: Emergency Medicine | Admitting: Emergency Medicine

## 2019-07-22 ENCOUNTER — Encounter (HOSPITAL_COMMUNITY): Payer: Self-pay | Admitting: Emergency Medicine

## 2019-07-22 DIAGNOSIS — T407X1A Poisoning by cannabis (derivatives), accidental (unintentional), initial encounter: Secondary | ICD-10-CM | POA: Diagnosis not present

## 2019-07-22 DIAGNOSIS — R112 Nausea with vomiting, unspecified: Secondary | ICD-10-CM | POA: Insufficient documentation

## 2019-07-22 DIAGNOSIS — R69 Illness, unspecified: Secondary | ICD-10-CM | POA: Diagnosis not present

## 2019-07-22 DIAGNOSIS — R109 Unspecified abdominal pain: Secondary | ICD-10-CM | POA: Diagnosis present

## 2019-07-22 DIAGNOSIS — R1084 Generalized abdominal pain: Secondary | ICD-10-CM | POA: Diagnosis not present

## 2019-07-22 DIAGNOSIS — R52 Pain, unspecified: Secondary | ICD-10-CM | POA: Diagnosis not present

## 2019-07-22 DIAGNOSIS — R064 Hyperventilation: Secondary | ICD-10-CM | POA: Diagnosis not present

## 2019-07-22 DIAGNOSIS — R0902 Hypoxemia: Secondary | ICD-10-CM | POA: Diagnosis not present

## 2019-07-22 DIAGNOSIS — F12188 Cannabis abuse with other cannabis-induced disorder: Secondary | ICD-10-CM

## 2019-07-22 LAB — URINALYSIS, ROUTINE W REFLEX MICROSCOPIC
Bilirubin Urine: NEGATIVE
Glucose, UA: NEGATIVE mg/dL
Hgb urine dipstick: NEGATIVE
Ketones, ur: 80 mg/dL — AB
Leukocytes,Ua: NEGATIVE
Nitrite: NEGATIVE
Protein, ur: 100 mg/dL — AB
Specific Gravity, Urine: 1.027 (ref 1.005–1.030)
pH: 8 (ref 5.0–8.0)

## 2019-07-22 LAB — COMPREHENSIVE METABOLIC PANEL
ALT: 14 U/L (ref 0–44)
AST: 18 U/L (ref 15–41)
Albumin: 4.8 g/dL (ref 3.5–5.0)
Alkaline Phosphatase: 48 U/L (ref 38–126)
Anion gap: 10 (ref 5–15)
BUN: 11 mg/dL (ref 6–20)
CO2: 26 mmol/L (ref 22–32)
Calcium: 9.4 mg/dL (ref 8.9–10.3)
Chloride: 104 mmol/L (ref 98–111)
Creatinine, Ser: 0.88 mg/dL (ref 0.61–1.24)
GFR calc Af Amer: >60 mL/min (ref >60)
GFR calc non Af Amer: >60 mL/min (ref >60)
Glucose, Bld: 121 mg/dL — ABNORMAL HIGH (ref 70–99)
Potassium: 4.1 mmol/L (ref 3.5–5.1)
Sodium: 140 mmol/L (ref 135–145)
Total Bilirubin: 0.7 mg/dL (ref 0.3–1.2)
Total Protein: 8.2 g/dL — ABNORMAL HIGH (ref 6.5–8.1)

## 2019-07-22 LAB — CBC
HCT: 44.3 % (ref 39.0–52.0)
Hemoglobin: 15.4 g/dL (ref 13.0–17.0)
MCH: 29.3 pg (ref 26.0–34.0)
MCHC: 34.8 g/dL (ref 30.0–36.0)
MCV: 84.2 fL (ref 80.0–100.0)
Platelets: 274 10*3/uL (ref 150–400)
RBC: 5.26 MIL/uL (ref 4.22–5.81)
RDW: 13 % (ref 11.5–15.5)
WBC: 11.5 10*3/uL — ABNORMAL HIGH (ref 4.0–10.5)
nRBC: 0 % (ref 0.0–0.2)

## 2019-07-22 LAB — LIPASE, BLOOD: Lipase: 18 U/L (ref 11–51)

## 2019-07-22 MED ORDER — ONDANSETRON HCL 4 MG PO TABS
4.0000 mg | ORAL_TABLET | Freq: Three times a day (TID) | ORAL | 0 refills | Status: DC | PRN
Start: 2019-07-22 — End: 2020-12-11

## 2019-07-22 MED ORDER — SODIUM CHLORIDE 0.9% FLUSH: 3.0000 mL | Freq: Once | INTRAVENOUS | Status: DC

## 2019-07-22 NOTE — Discharge Instructions (Addendum)
Please make sure to return to the ER if you have worsening abdominal pain, fevers, chills or any other concerning symptoms.  Please stop smoking marijuana.

## 2019-07-22 NOTE — ED Triage Notes (Signed)
Patient here from home reporting left sided abd pain, n/v that started at 8am. 4mg  of Zofran given.

## 2019-07-22 NOTE — ED Provider Notes (Signed)
[facility] DEPT Provider Note   CSN: [id number] Arrival date & time: 07/22/19  2409     History Chief Complaint  Patient presents with  . Abdominal Pain  . Nausea  . Emesis    Douglas Moore is a 24 y.o. male.  HPI 24 year old male with no significant medical history resents to the ER for nausea, vomiting and abdominal pain which started this morning.  Patient states that he frequently has episodes of vomiting, however at around 8 AM he felt like "his body was shutting down" and he had multiple episodes of vomiting back to back.  He states that he felt short of breath, as if he was having a panic attack.  He describes bloody nonbilious vomiting.  Has had poor p.o. intake all day.  He describes associated left-sided abdominal pain.  Symptoms improved after EMS gave him Zofran.  He denies any chest pain, fevers, chills, diarrhea, headache.  No recent travel, no recent surgeries.  He does not use alcohol but admits to smoking marijuana 2 days ago.  He was seen with similar complaints in the ED in February, and had a CT of the abdomen done which was negative.    History reviewed. No pertinent past medical history.  There are no problems to display for this patient.   History reviewed. No pertinent surgical history.     No family history on file.  Social History   Tobacco Use  . Smoking status: Never Smoker  . Smokeless tobacco: Never Used  Substance Use Topics  . Alcohol use: Yes  . Drug use: Yes    Types: Marijuana    Home Medications Prior to Admission medications   Medication Sig Start Date End Date Taking? Authorizing Provider  ondansetron (ZOFRAN ODT) 4 MG disintegrating tablet Take 1 tablet (4 mg total) by mouth every 8 (eight) hours as needed for nausea or vomiting. Patient not taking: Reported on 07/22/2019 03/23/19   Albrizze, Verline Lema E, PA-C  ondansetron (ZOFRAN) 4 MG tablet Take 1-2 tabs by mouth every 8 hours as needed for  nausea/vomiting Patient not taking: Reported on 07/22/2019 02/08/15   Hinda Kehr, MD  traMADol (ULTRAM) 50 MG tablet Take 1 tablet (50 mg total) by mouth every 6 (six) hours as needed. Patient not taking: Reported on 07/22/2019 09/18/17   Loney Hering, MD    Allergies    Patient has no known allergies.  Review of Systems   Review of Systems  Constitutional: Negative for chills and fever.  Gastrointestinal: Positive for abdominal pain, nausea and vomiting.    Physical Exam Updated Vital Signs BP 129/74   Pulse 69   Temp 98 F (36.7 C) (Oral)   Resp 16   SpO2 97%   Physical Exam Vitals and nursing note reviewed.  Constitutional:      General: He is not in acute distress.    Appearance: He is well-developed. He is not ill-appearing, toxic-appearing or diaphoretic.     Comments: Well-appearing male, sitting comfortably in the ER bed, on his phone and laughing  HENT:     Head: Normocephalic and atraumatic.  Eyes:     Conjunctiva/sclera: Conjunctivae normal.  Cardiovascular:     Rate and Rhythm: Normal rate and regular rhythm.     Heart sounds: Normal heart sounds. No murmur heard.   Pulmonary:     Effort: Pulmonary effort is normal. No respiratory distress.     Breath sounds: Normal breath sounds.  Abdominal:  General: Abdomen is flat. Bowel sounds are normal.     Palpations: Abdomen is soft.     Tenderness: There is no abdominal tenderness. There is no right CVA tenderness or left CVA tenderness. Negative signs include Murphy's sign and McBurney's sign.     Hernia: No hernia is present.  Musculoskeletal:     Cervical back: Neck supple.  Skin:    General: Skin is warm and dry.     Coloration: Skin is not cyanotic or pale.     Findings: No erythema or rash.  Neurological:     General: No focal deficit present.     Mental Status: He is alert.  Psychiatric:        Mood and Affect: Mood normal.        Behavior: Behavior normal.     ED Results / Procedures /  Treatments   Labs (all labs ordered are listed, but only abnormal results are displayed) Labs Reviewed  COMPREHENSIVE METABOLIC PANEL - Abnormal; Notable for the following components:      Result Value   Glucose, Bld 121 (*)    Total Protein 8.2 (*)    All other components within normal limits  CBC - Abnormal; Notable for the following components:   WBC 11.5 (*)    All other components within normal limits  URINALYSIS, ROUTINE W REFLEX MICROSCOPIC - Abnormal; Notable for the following components:   Ketones, ur 80 (*)    Protein, ur 100 (*)    Bacteria, UA RARE (*)    All other components within normal limits  LIPASE, BLOOD    EKG None  Radiology No results found.  Procedures Procedures (including critical care time)  Medications Ordered in ED Medications  sodium chloride flush (NS) 0.9 % injection 3 mL (has no administration in time range)    ED Course  I have reviewed the triage vital signs and the nursing notes.  Pertinent labs & imaging results that were available during my care of the patient were reviewed by me and considered in my medical decision making (see chart for details).    MDM Rules/Calculators/A&P                         24 year old male with nausea, vomiting, abdominal pain which started this morning On presentation, the patient is alert and oriented, nontoxic-appearing, no acute distress.  He is sitting on his phone and laughing with his friends in the room.  Vitals are overall reassuring, though he is slightly hypertensive.  Physical exam without significant abdominal tenderness, he is in no acute distress, not actively vomiting.  CBC with mild leukocytosis of 11.5, normal lipase.  CMP without significant electrolyte abnormalities, normal BUN/creatinine.  Glucose of 121.  UA without evidence of UTI.  The patient was seen here in February with similar complaints and had a CT scan.  He does admit to smoking marijuana 2 days ago.  Given his soft and  nontender abdomen, do not think any repeat imaging is indicated at this time.  Suspect his symptoms are secondary to cannabis hyperemesis.  I discussed this with the patient, and he agrees that his symptoms are likely due to this.  Will p.o. challenge and reassess.  5:15 PM: On reevaluation, patient was able to tolerate p.o. without difficulty.  He notes significant improvement in his symptoms.  He and I both discussed that there is no additional indication for further imaging at this time.  Will DC with  several pills of Zofran.  Encourage patient to cease marijuana use.  He voices understanding and is agreeable to this plan.  Strict return precautions given.  At this stage in the ED course, the patient has been medically screened and is stable for discharge. Final Clinical Impression(s) / ED Diagnoses Final diagnoses:  Cannabis hyperemesis syndrome concurrent with and due to cannabis abuse [facility])    Rx / DC Orders ED Discharge Orders    None       Leone Brand 07/22/19 1736    Raeford Razor, MD 07/22/19 1805

## 2019-10-07 DIAGNOSIS — Z03818 Encounter for observation for suspected exposure to other biological agents ruled out: Secondary | ICD-10-CM | POA: Diagnosis not present

## 2019-10-07 DIAGNOSIS — Z20822 Contact with and (suspected) exposure to covid-19: Secondary | ICD-10-CM | POA: Diagnosis not present

## 2020-12-11 ENCOUNTER — Emergency Department
Admission: EM | Admit: 2020-12-11 | Discharge: 2020-12-11 | Disposition: A | Discharge status: Home / Self Care | Payer: 59 | Attending: Emergency Medicine | Admitting: Emergency Medicine

## 2020-12-11 ENCOUNTER — Other Ambulatory Visit: Payer: Self-pay

## 2020-12-11 ENCOUNTER — Encounter: Payer: Self-pay | Admitting: Emergency Medicine

## 2020-12-11 DIAGNOSIS — R100 Acute abdomen: Secondary | ICD-10-CM | POA: Diagnosis not present

## 2020-12-11 DIAGNOSIS — R112 Nausea with vomiting, unspecified: Secondary | ICD-10-CM | POA: Insufficient documentation

## 2020-12-11 DIAGNOSIS — R1084 Generalized abdominal pain: Secondary | ICD-10-CM | POA: Diagnosis not present

## 2020-12-11 DIAGNOSIS — R1111 Vomiting without nausea: Secondary | ICD-10-CM | POA: Diagnosis not present

## 2020-12-11 DIAGNOSIS — R5381 Other malaise: Secondary | ICD-10-CM | POA: Diagnosis not present

## 2020-12-11 DIAGNOSIS — R69 Illness, unspecified: Secondary | ICD-10-CM | POA: Diagnosis not present

## 2020-12-11 DIAGNOSIS — R52 Pain, unspecified: Secondary | ICD-10-CM | POA: Diagnosis not present

## 2020-12-11 DIAGNOSIS — R1110 Vomiting, unspecified: Secondary | ICD-10-CM

## 2020-12-11 LAB — COMPREHENSIVE METABOLIC PANEL
ALT: 19 U/L (ref 0–44)
AST: 20 U/L (ref 15–41)
Albumin: 4.5 g/dL (ref 3.5–5.0)
Alkaline Phosphatase: 47 U/L (ref 38–126)
Anion gap: 9 (ref 5–15)
BUN: 10 mg/dL (ref 6–20)
CO2: 25 mmol/L (ref 22–32)
Calcium: 9.3 mg/dL (ref 8.9–10.3)
Chloride: 106 mmol/L (ref 98–111)
Creatinine, Ser: 0.83 mg/dL (ref 0.61–1.24)
GFR, Estimated: >60 mL/min (ref >60)
Glucose, Bld: 116 mg/dL — ABNORMAL HIGH (ref 70–99)
Potassium: 3.3 mmol/L — ABNORMAL LOW (ref 3.5–5.1)
Sodium: 140 mmol/L (ref 135–145)
Total Bilirubin: 0.9 mg/dL (ref 0.3–1.2)
Total Protein: 7.6 g/dL (ref 6.5–8.1)

## 2020-12-11 LAB — LIPASE, BLOOD: Lipase: 25 U/L (ref 11–51)

## 2020-12-11 LAB — CBC
HCT: 41.7 % (ref 39.0–52.0)
Hemoglobin: 15.1 g/dL (ref 13.0–17.0)
MCH: 29.8 pg (ref 26.0–34.0)
MCHC: 36.2 g/dL — ABNORMAL HIGH (ref 30.0–36.0)
MCV: 82.2 fL (ref 80.0–100.0)
Platelets: 263 10*3/uL (ref 150–400)
RBC: 5.07 MIL/uL (ref 4.22–5.81)
RDW: 12.6 % (ref 11.5–15.5)
WBC: 12.6 10*3/uL — ABNORMAL HIGH (ref 4.0–10.5)
nRBC: 0 % (ref 0.0–0.2)

## 2020-12-11 MED ORDER — ONDANSETRON 4 MG PO TBDP
4.0000 mg | ORAL_TABLET | Freq: Three times a day (TID) | ORAL | 0 refills | Status: DC | PRN
Start: 2020-12-11 — End: 2022-07-02

## 2020-12-11 MED ORDER — ZIKS ARTHRITIS PAIN RELIEF 0.025-1-12 % EX CREA: 1.0000 "application " | TOPICAL_CREAM | Freq: Every day | CUTANEOUS | 0 refills | Status: AC | PRN

## 2020-12-11 MED ORDER — DROPERIDOL 2.5 MG/ML IJ SOLN
1.2500 mg | Freq: Once | INTRAMUSCULAR | Status: AC
  Administered 2020-12-11: 1.25 mg via INTRAVENOUS
  Filled 2020-12-11: qty 2

## 2020-12-11 NOTE — ED Triage Notes (Signed)
Pt in via EMS from home with c/o intermittent hyperemesis related to marijuana for the past year. Pt reports has not smoked week in about a month and this am started with the same sx's. Pt reports pain is worse than before.  @20  left FA, 4mg  of zofran given, 148/88, FSBS 142, 100%, HR 64  Pt reports pain is all over belly and crampy and sharp in nature.

## 2020-12-11 NOTE — ED Provider Notes (Signed)
[facility] Emergency Department Provider Note  ____________________________________________  Time seen: Approximately 1:56 PM  I have reviewed the triage vital signs and the nursing notes.   HISTORY  Chief Complaint Abdominal Pain, Nausea, and Emesis    HPI Douglas Moore is a 25 y.o. male who presented to the emergency department with ongoing emesis.  Patient has a history of cannabinoid hyperemesis.  He states that he typically smokes multiple times a week, reportedly had not smoked since last week.  However this is somewhat questionable as patient had informed triage that it been over a month, and it was reportedly last week.  Regardless patient presents with emesis.  He presented via EMS, had IV Zofran and this has ceased his emesis.  There is no reported fevers, chills, URI symptoms.  No abdominal pain at this time.  No urinary changes.  No diarrhea or constipation.  Symptoms had largely improved with Zofran.       History reviewed. No pertinent past medical history.  There are no problems to display for this patient.   History reviewed. No pertinent surgical history.  Prior to Admission medications   Medication Sig Start Date End Date Taking? Authorizing Provider  Capsaicin-Menthol-Methyl Sal (CAPSAICIN-METHYL SAL-MENTHOL) 0.025-1-12 % CREA Apply 1 application topically daily as needed (Nausea/vomiting, abdominal cramping). Apply to abdomen 12/11/20  Yes Josaiah Muhammed, Delorise Royals, PA-C  ondansetron (ZOFRAN-ODT) 4 MG disintegrating tablet Take 1 tablet (4 mg total) by mouth every 8 (eight) hours as needed for nausea or vomiting. 12/11/20  Yes Oluwasemilore Bahl, Delorise Royals, PA-C  traMADol (ULTRAM) 50 MG tablet Take 1 tablet (50 mg total) by mouth every 6 (six) hours as needed. Patient not taking: Reported on 07/22/2019 09/18/17   Rebecka Apley, MD    Allergies Patient has no known allergies.  No family history on file.  Social History Social  History   Tobacco Use   Smoking status: Never   Smokeless tobacco: Never  Substance Use Topics   Alcohol use: Yes   Drug use: Yes    Types: Marijuana     Review of Systems  Constitutional: No fever/chills Eyes: No visual changes. No discharge ENT: No upper respiratory complaints. Cardiovascular: no chest pain. Respiratory: no cough. No SOB. Gastrointestinal: No abdominal pain.  Positive for nausea and emesis.  No diarrhea.  No constipation. Genitourinary: Negative for dysuria. No hematuria Musculoskeletal: Negative for musculoskeletal pain. Skin: Negative for rash, abrasions, lacerations, ecchymosis. Neurological: Negative for headaches, focal weakness or numbness.  10 System ROS otherwise negative.  ____________________________________________   PHYSICAL EXAM:  VITAL SIGNS: ED Triage Vitals [12/11/20 1214]  Enc Vitals Group     BP 139/78     Pulse Rate 67     Resp 20     Temp 97.8 F (36.6 C)     Temp Source Oral     SpO2 98 %     Weight 160 lb (72.6 kg)     Height 5\' 7"  (1.702 m)     Head Circumference      Peak Flow      Pain Score 7     Pain Loc      Pain Edu?      Excl. in GC?      Constitutional: Alert and oriented. Well appearing and in no acute distress. Eyes: Conjunctivae are normal. PERRL. EOMI. Head: Atraumatic. ENT:      Ears:       Nose: No congestion/rhinnorhea.  Mouth/Throat: Mucous membranes are moist.  Neck: No stridor.    Cardiovascular: Normal rate, regular rhythm. Normal S1 and S2.  Good peripheral circulation. Respiratory: Normal respiratory effort without tachypnea or retractions. Lungs CTAB. Good air entry to the bases with no decreased or absent breath sounds. Gastrointestinal: Bowel sounds 4 quadrants. Soft and nontender to palpation. No guarding or rigidity. No palpable masses. No distention. No CVA tenderness. Musculoskeletal: Full range of motion to all extremities. No gross deformities appreciated. Neurologic:  Normal  speech and language. No gross focal neurologic deficits are appreciated.  Skin:  Skin is warm, dry and intact. No rash noted. Psychiatric: Mood and affect are normal. Speech and behavior are normal. Patient exhibits appropriate insight and judgement.   ____________________________________________   LABS (all labs ordered are listed, but only abnormal results are displayed)  Labs Reviewed  COMPREHENSIVE METABOLIC PANEL - Abnormal; Notable for the following components:      Result Value   Potassium 3.3 (*)    Glucose, Bld 116 (*)    All other components within normal limits  CBC - Abnormal; Notable for the following components:   WBC 12.6 (*)    MCHC 36.2 (*)    All other components within normal limits  LIPASE, BLOOD  URINALYSIS, ROUTINE W REFLEX MICROSCOPIC   ____________________________________________  EKG   ____________________________________________  RADIOLOGY   No results found.  ____________________________________________    PROCEDURES  Procedure(s) performed:    Procedures    Medications  droperidol (INAPSINE) 2.5 MG/ML injection 1.25 mg (has no administration in time range)     ____________________________________________   INITIAL IMPRESSION / ASSESSMENT AND PLAN / ED COURSE  Pertinent labs & imaging results that were available during my care of the patient were reviewed by me and considered in my medical decision making (see chart for details).  Review of the [place] CSRS was performed in accordance of the [place] prior to dispensing any controlled drugs.           Patient's diagnosis is consistent with patient presented to the ED with repetitive emesis.  His labs are reassuring.  Exam was reassuring with no tenderness.  Symptoms had resolved with dose of Zofran from EMS.  I provided droperidol with no return of symptoms and as such I will discharge the patient with a prescription for ODT Zofran and capsaicin cream.  I feel that this is likely  hyperemesis his symptoms are similar to his previous episodes.  He has a heavy marijuana user.  At this time again labs are reassuring.  Labs are roughly at patient's baseline. No indications for imaging as patient has no tenderness and no reported abdominal pain.  Patient has had CT scans of similar symptoms in the past which are reassuring.  There is no significant changes from patient's normal hyperemesis, labs are at his baseline I do not feel that patient needs further imaging at this time.  At this time return precautions are discussed with the patient to include fever, chills, ongoing symptoms despite medication, abdominal pain.  Patient verbalizes understanding of same.  Patient is agreeable with discharge at this time..  Patient is given ED precautions to return to the ED for any worsening or new symptoms.     ____________________________________________  FINAL CLINICAL IMPRESSION(S) / ED DIAGNOSES  Final diagnoses:  Hyperemesis      NEW MEDICATIONS STARTED DURING THIS VISIT:  ED Discharge Orders          Ordered    ondansetron (ZOFRAN-ODT) 4  MG disintegrating tablet  Every 8 hours PRN        12/11/20 1431    Capsaicin-Menthol-Methyl Sal (CAPSAICIN-METHYL SAL-MENTHOL) 0.025-1-12 % CREA  Daily PRN        12/11/20 1431                This chart was dictated using voice recognition software/Dragon. Despite best efforts to proofread, errors can occur which can change the meaning. Any change was purely unintentional.    Darletta Moll, PA-C 12/11/20 1432    Vladimir Crofts, MD 12/11/20 [phone]

## 2020-12-11 NOTE — ED Notes (Signed)
Iv removed. Dc ppw provided. Pt rx information given. Pt instructed to stop ingesting THC to prevent nausea. Pt verbal consent for dc provided. Pt off unit with SO on foot

## 2021-07-08 IMAGING — CT CT ABD-PELV W/ CM
2 of 4 series · 16 of 46 positions shown, 18 images · IV contrast (APPLIED)
Comparison: None.

CLINICAL DATA: Acute generalized abdominal pain, vomiting.

EXAM:
CT ABDOMEN AND PELVIS WITH CONTRAST
TECHNIQUE: Multidetector CT imaging of the abdomen and pelvis was performed
using the standard protocol following bolus administration of
intravenous contrast.
CONTRAST:  100mL OMNIPAQUE IOHEXOL 300 MG/ML  SOLN

[Series 3: abd/ pelvis 5.0 i30f 2 · axial · 0.71mm/px · z∈[+599,+1059]mm · 13 of 102 slices shown, 15 images]
[im 5/102  soft-tissue]
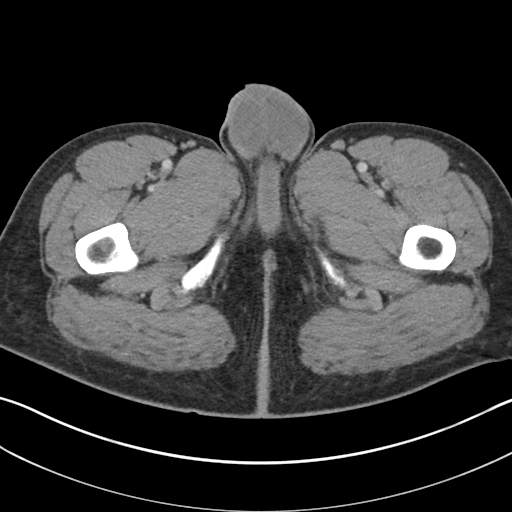
[im 5/102  bone]
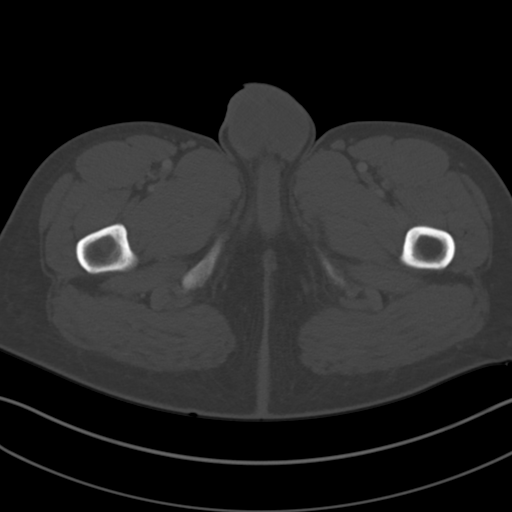
[im 13/102  soft-tissue]
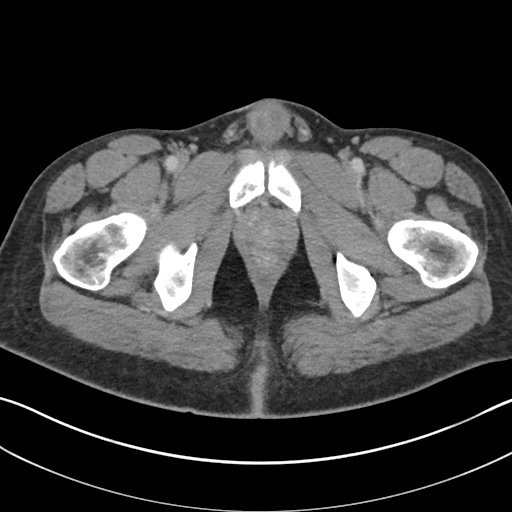
[im 21/102  soft-tissue]
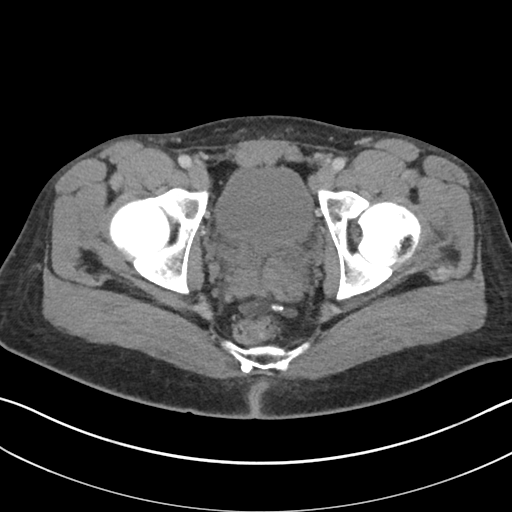
[im 29/102  soft-tissue]
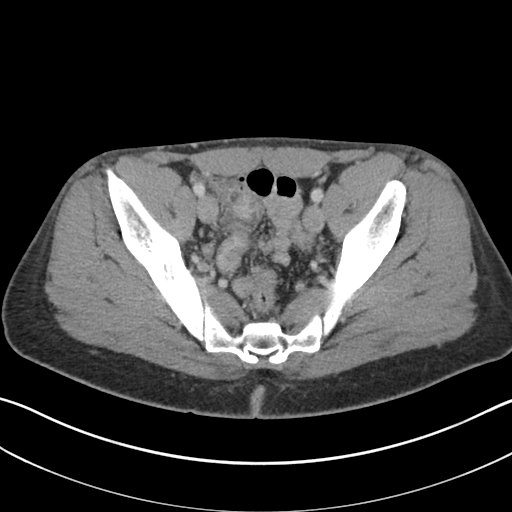
[im 37/102  soft-tissue]
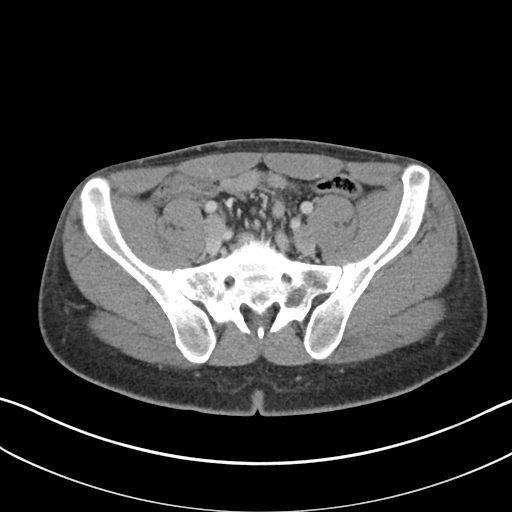
[im 45/102  soft-tissue]
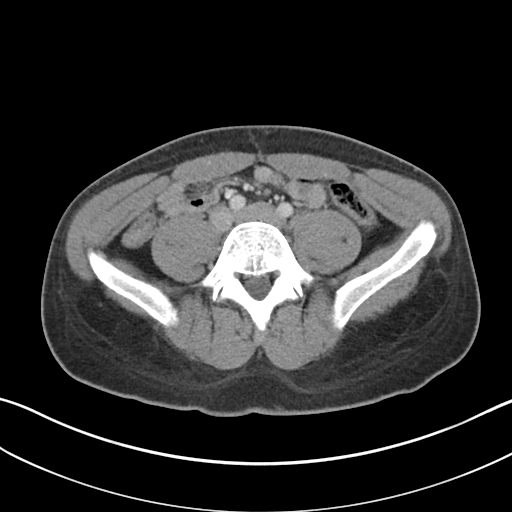
[im 53/102  soft-tissue]
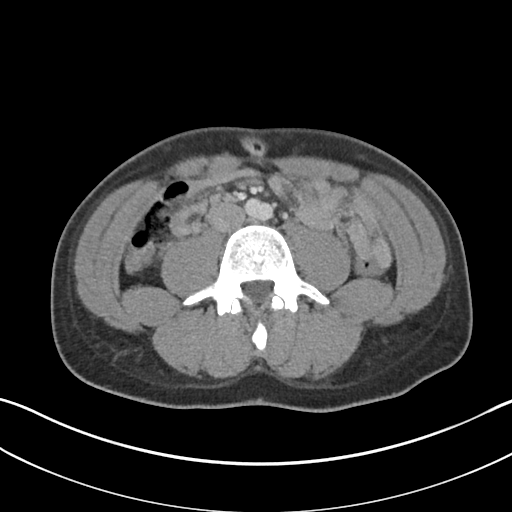
[im 57/102  soft-tissue]
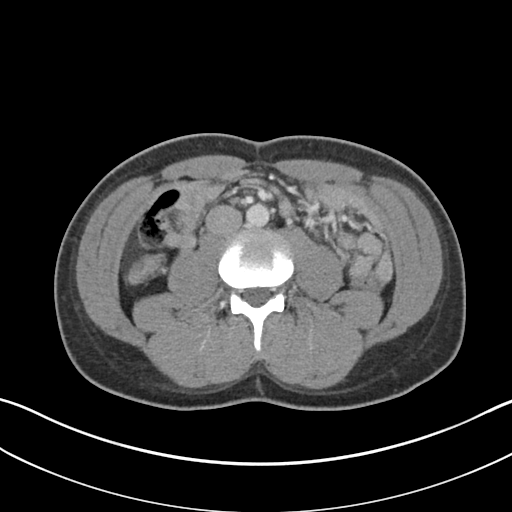
[im 65/102  soft-tissue]
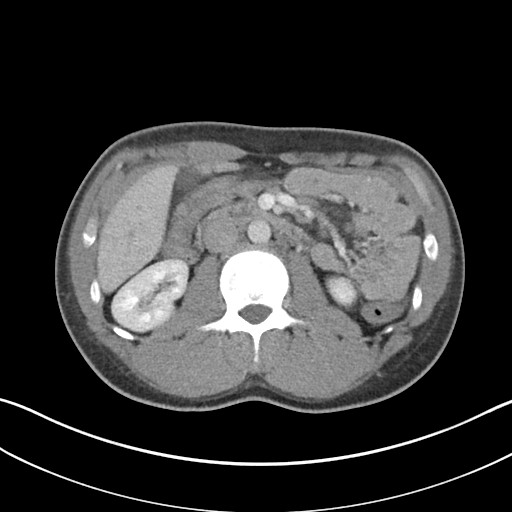
[im 65/102  bone]
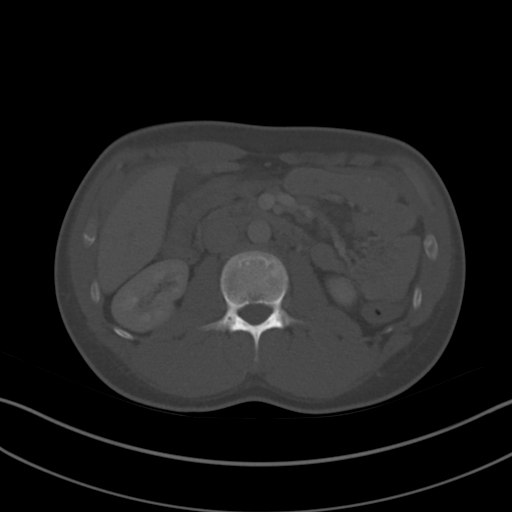
[im 73/102  soft-tissue]
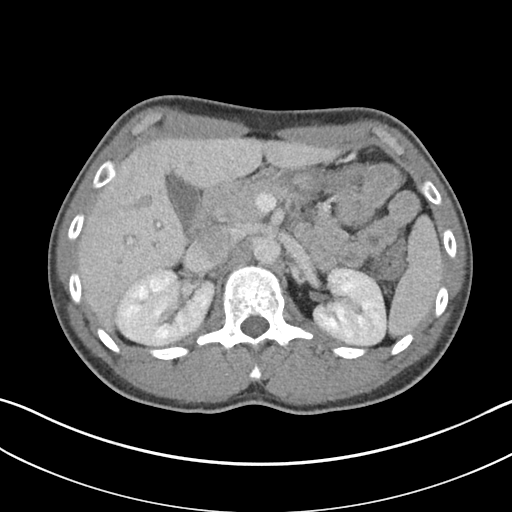
[im 81/102  soft-tissue]
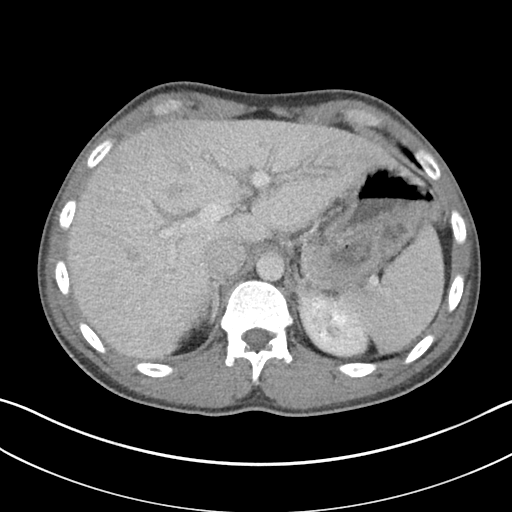
[im 89/102  soft-tissue]
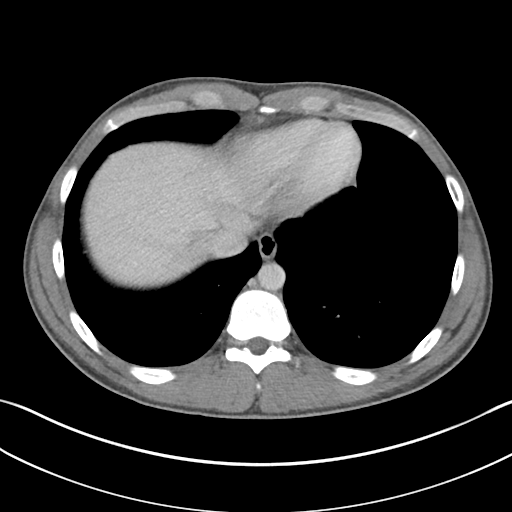
[im 97/102  soft-tissue]
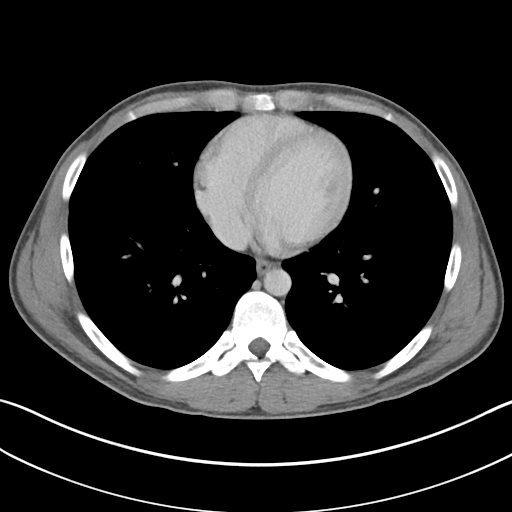

[Series 6: coronal soft tissue · coronal · 0.75mm/px · 3 of 93 slices shown]
[im 31/93  soft-tissue]
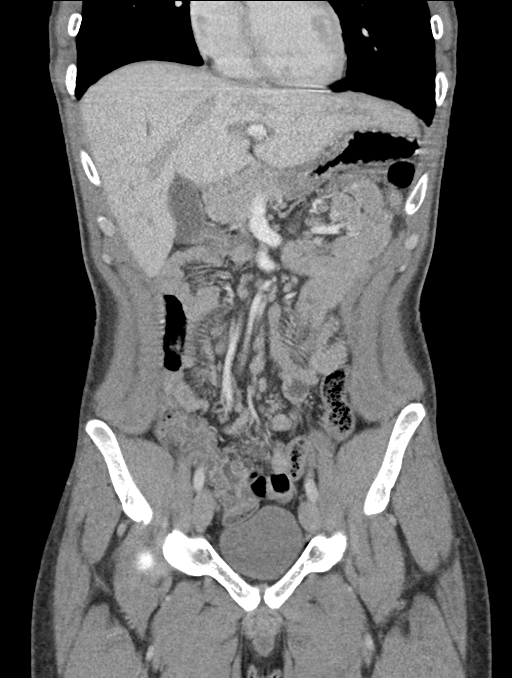
[im 41/93  soft-tissue]
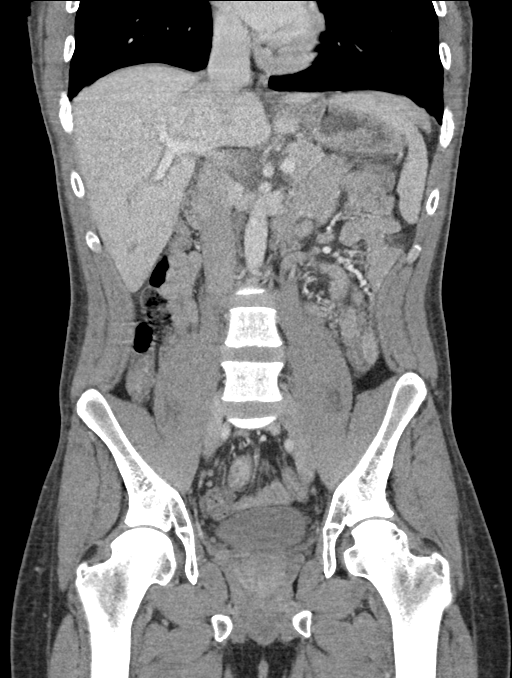
[im 52/93  soft-tissue]
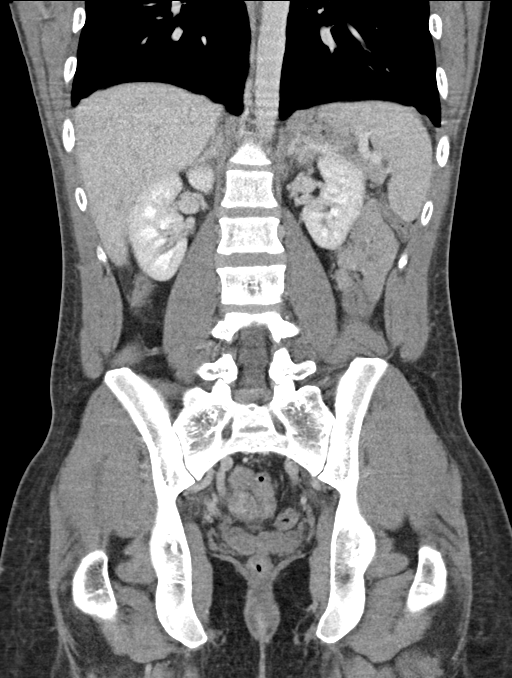

[16 of 46 positions shown; findings below may reference images not displayed]

FINDINGS: Lower chest: No acute abnormality.

Hepatobiliary: No focal liver abnormality is seen. No gallstones,
gallbladder wall thickening, or biliary dilatation.

Pancreas: Unremarkable. No pancreatic ductal dilatation or
surrounding inflammatory changes.

Spleen: Normal in size without focal abnormality.

Adrenals/Urinary Tract: Adrenal glands are unremarkable. Kidneys are
normal, without renal calculi, focal lesion, or hydronephrosis.
Bladder is unremarkable.

Stomach/Bowel: Stomach is within normal limits. Appendix appears
normal. No evidence of bowel wall thickening, distention, or
inflammatory changes.

Vascular/Lymphatic: No significant vascular findings are present. No
enlarged abdominal or pelvic lymph nodes.

Reproductive: Prostate is unremarkable.

Other: No abdominal wall hernia or abnormality. No abdominopelvic
ascites.

Musculoskeletal: No acute or significant osseous findings.
IMPRESSION: No definite abnormality seen in the abdomen or pelvis.

## 2022-07-02 ENCOUNTER — Emergency Department
Admission: EM | Admit: 2022-07-02 | Discharge: 2022-07-02 | Disposition: A | Discharge status: Home / Self Care | Payer: Self-pay | Attending: Emergency Medicine | Admitting: Emergency Medicine

## 2022-07-02 ENCOUNTER — Other Ambulatory Visit: Payer: Self-pay

## 2022-07-02 ENCOUNTER — Emergency Department: Payer: Self-pay

## 2022-07-02 ENCOUNTER — Encounter: Payer: Self-pay | Admitting: Intensive Care

## 2022-07-02 DIAGNOSIS — D72829 Elevated white blood cell count, unspecified: Secondary | ICD-10-CM | POA: Insufficient documentation

## 2022-07-02 DIAGNOSIS — K529 Noninfective gastroenteritis and colitis, unspecified: Secondary | ICD-10-CM

## 2022-07-02 DIAGNOSIS — R7989 Other specified abnormal findings of blood chemistry: Secondary | ICD-10-CM | POA: Insufficient documentation

## 2022-07-02 DIAGNOSIS — R1013 Epigastric pain: Secondary | ICD-10-CM | POA: Insufficient documentation

## 2022-07-02 LAB — COMPREHENSIVE METABOLIC PANEL
ALT: 12 U/L (ref 0–44)
AST: 30 U/L (ref 15–41)
Albumin: 5.1 g/dL — ABNORMAL HIGH (ref 3.5–5.0)
Alkaline Phosphatase: 59 U/L (ref 38–126)
Anion gap: 12 (ref 5–15)
BUN: 12 mg/dL (ref 6–20)
CO2: 21 mmol/L — ABNORMAL LOW (ref 22–32)
Calcium: 9.7 mg/dL (ref 8.9–10.3)
Chloride: 104 mmol/L (ref 98–111)
Creatinine, Ser: 0.89 mg/dL (ref 0.61–1.24)
GFR, Estimated: >60 mL/min (ref >60)
Glucose, Bld: 128 mg/dL — ABNORMAL HIGH (ref 70–99)
Potassium: 3.6 mmol/L (ref 3.5–5.1)
Sodium: 137 mmol/L (ref 135–145)
Total Bilirubin: 1.3 mg/dL — ABNORMAL HIGH (ref 0.3–1.2)
Total Protein: 8.5 g/dL — ABNORMAL HIGH (ref 6.5–8.1)

## 2022-07-02 LAB — LIPASE, BLOOD: Lipase: 21 U/L (ref 11–51)

## 2022-07-02 LAB — CBC
HCT: 45.1 % (ref 39.0–52.0)
Hemoglobin: 15.9 g/dL (ref 13.0–17.0)
MCH: 28.8 pg (ref 26.0–34.0)
MCHC: 35.3 g/dL (ref 30.0–36.0)
MCV: 81.6 fL (ref 80.0–100.0)
Platelets: 283 10*3/uL (ref 150–400)
RBC: 5.53 MIL/uL (ref 4.22–5.81)
RDW: 12.7 % (ref 11.5–15.5)
WBC: 13.2 10*3/uL — ABNORMAL HIGH (ref 4.0–10.5)
nRBC: 0 % (ref 0.0–0.2)

## 2022-07-02 LAB — TROPONIN I (HIGH SENSITIVITY): Troponin I (High Sensitivity): 4 ng/L (ref <18)

## 2022-07-02 LAB — MAGNESIUM: Magnesium: 2 mg/dL (ref 1.7–2.4)

## 2022-07-02 MED ORDER — DROPERIDOL 2.5 MG/ML IJ SOLN
1.2500 mg | Freq: Once | INTRAMUSCULAR | Status: AC
  Administered 2022-07-02: 1.25 mg via INTRAVENOUS
  Filled 2022-07-02: qty 2

## 2022-07-02 MED ORDER — ACETAMINOPHEN 500 MG PO TABS
1000.0000 mg | ORAL_TABLET | Freq: Once | ORAL | Status: AC
  Administered 2022-07-02: 1000 mg via ORAL
  Filled 2022-07-02: qty 2

## 2022-07-02 MED ORDER — SODIUM CHLORIDE 0.9 % IV BOLUS
1000.0000 mL | Freq: Once | INTRAVENOUS | Status: AC
  Administered 2022-07-02: 1000 mL via INTRAVENOUS

## 2022-07-02 MED ORDER — PANTOPRAZOLE SODIUM 40 MG IV SOLR
40.0000 mg | Freq: Once | INTRAVENOUS | Status: AC
  Administered 2022-07-02: 40 mg via INTRAVENOUS
  Filled 2022-07-02: qty 10

## 2022-07-02 MED ORDER — ONDANSETRON 4 MG PO TBDP: 4.0000 mg | ORAL_TABLET | Freq: Three times a day (TID) | ORAL | 0 refills | Status: AC | PRN

## 2022-07-02 MED ORDER — ONDANSETRON HCL 4 MG/2ML IJ SOLN
4.0000 mg | Freq: Once | INTRAMUSCULAR | Status: AC
  Administered 2022-07-02: 4 mg via INTRAVENOUS
  Filled 2022-07-02: qty 2

## 2022-07-02 NOTE — ED Provider Notes (Signed)
[facility] Provider Note    Event Date/Time   First MD Initiated Contact with Patient 07/02/22 [phone]     (approximate)   History   Emesis and Abdominal Pain   HPI  Douglas Moore is a 27 y.o. male who comes in with concerns for vomiting since last night.  Patient reports that he ordered some steak from a restaurant and he did not eat it when he initially got around 6:00 but then heated up around 9:00.  A few hours later he developed nonbloody nonbilious vomiting multiple episodes as well as diarrhea.  He denies any blood in them.  He states that he just felt very weak and felt like he was going to pass out which is what prompted him to come in today.  He reports some cramping in his hands.  Denies any falls or hitting his head.  Denies any chest pain, shortness of breath.  Physical Exam   Triage Vital Signs: ED Triage Vitals [07/02/22 0911]  Enc Vitals Group     BP      Pulse      Resp      Temp      Temp src      SpO2      Weight 175 lb (79.4 kg)     Height 5\' 10"  (1.778 m)     Head Circumference      Peak Flow      Pain Score 9     Pain Loc      Pain Edu?      Excl. in GC?     Most recent vital signs: Vitals:   07/02/22 0913  BP: 135/81  Pulse: 84  Resp: 20  Temp: 98 F (36.7 C)  SpO2: 95%     General: Awake, no distress.  CV:  Good peripheral perfusion.  Resp:  Normal effort.  Abd:  No distention.  Other:  No chest wall tenderness.  No crepitus.  Some reported cramping but no rebound or guarding on abdominal exam.   ED Results / Procedures / Treatments   Labs (all labs ordered are listed, but only abnormal results are displayed) Labs Reviewed  CBC - Abnormal; Notable for the following components:      Result Value   WBC 13.2 (*)    All other components within normal limits  LIPASE, BLOOD  COMPREHENSIVE METABOLIC PANEL  URINALYSIS, ROUTINE W REFLEX MICROSCOPIC  MAGNESIUM  TROPONIN I (HIGH SENSITIVITY)      EKG  My interpretation of EKG:  Sinus rate of 89 without any significant ST elevations-the little bit in 2 and 3 but could be more artifact, normal intervals  RADIOLOGY I have reviewed the ultrasound personally interpreted no evidence of an gallstones. PROCEDURES:  Critical Care performed: No  .1-3 Lead EKG Interpretation  Performed by: Concha Se, MD Authorized by: Concha Se, MD     Interpretation: normal     ECG rate:  80   ECG rate assessment: normal     Rhythm: sinus rhythm     Ectopy: none     Conduction: normal      MEDICATIONS ORDERED IN ED: Medications  sodium chloride 0.9 % bolus 1,000 mL (1,000 mLs Intravenous New Bag/Given 07/02/22 0951)  sodium chloride 0.9 % bolus 1,000 mL (1,000 mLs Intravenous New Bag/Given 07/02/22 0951)  ondansetron (ZOFRAN) injection 4 mg (4 mg Intravenous Given 07/02/22 0942)  pantoprazole (PROTONIX) injection 40 mg (40 mg Intravenous Given  07/02/22 0945)  acetaminophen (TYLENOL) tablet 1,000 mg (1,000 mg Oral Given 07/02/22 0947)  droperidol (INAPSINE) 2.5 MG/ML injection 1.25 mg (1.25 mg Intravenous Given 07/02/22 1027)     IMPRESSION / MDM / ASSESSMENT AND PLAN / ED COURSE  I reviewed the triage vital signs and the nursing notes.   Patient's presentation is most consistent with acute presentation with potential threat to life or bodily function.   Patient comes in with what sounds like gastroenteritis.  He reports some cramping in his abdomen but with no rebound or guarding.  We discussed CT imaging and will reevaluate after fluids, nausea medicine, Protonix, Tylenol to decide if we would like to proceed.  Blood work ordered to evaluate for [employer] abnormalities, AKI.  Troponin was negative symptoms have been going on for greater than 3 hours.  Lipase normal.  CMP shows slightly elevated T. bili but otherwise normal LFTs.  White count slightly elevated  10:12 AM having more epigastric pain.  He does not want to do CT imaging he  is not really having any lower abdominal pain.  Will get ultrasound, chest x-ray to rule out perforation.  Ultrasound was reassuring.  Patient declined the chest x-ray.  Patient is requesting something to tolerate by mouth.  2:52 PM reevaluated patient he reports resolution of symptoms.  Abdomen is soft nontender has been tolerating p.o.  \He denies any urinary symptoms does not want to wait to give a urine test.  We discussed CT imaging again but given this time his symptoms have all resolved he would like to hold off we discussed return precautions in regards to appendicitis.  He does have a history of hyperemesis from marijuana but at this time is denying any marijuana use.  I suspect this is most likely gastroenteritis given resolution of symptoms and reassuring abdominal exam he would like to go home at this time.    FINAL CLINICAL IMPRESSION(S) / ED DIAGNOSES   Final diagnoses:  None     Rx / DC Orders   ED Discharge Orders          Ordered    ondansetron (ZOFRAN-ODT) 4 MG disintegrating tablet  Every 8 hours PRN        07/02/22 1454             Note:  This document was prepared using Dragon voice recognition software and may include unintentional dictation errors.   Concha Se, MD 07/02/22 [phone]

## 2022-07-02 NOTE — Progress Notes (Signed)
   07/02/22 1100  Spiritual Encounters  Type of Visit Initial  Care provided to: Pt and family  Conversation partners present during encounter Nurse  Referral source Patient request  Reason for visit Routine spiritual support  OnCall Visit No  Spiritual Framework  Presenting Themes Courage hope and growth  Community/Connection Family;Faith community  Interventions  Spiritual Care Interventions Made Established relationship of care and support;Compassionate presence;Reflective listening;Prayer;Encouragement  Intervention Outcomes  Outcomes Connection to spiritual care;Awareness around self/spiritual resourses;Reduced anxiety;Reduced fear  Spiritual Care Plan  Follow up plan  This Chaplain will continue to follow as patient has requested a follow up later in the day   Chaplain visited with patient via request and provided spiritual care with empathy. This Chaplain will follow up later in the day as the patient has asked for an additional blessing with oil and prayer.

## 2022-07-02 NOTE — ED Triage Notes (Signed)
Patient presents with abdominal pain, emesis, and hand cramping. Very unsteady on feet. Patient reports emesis since last night

## 2022-07-02 NOTE — Discharge Instructions (Signed)
We discussed CT imaging to rule out appendicitis but you had no pain in your right lower abdomen at the time.  Suspect this is most likely a viral bug causing you to have nausea vomiting diarrhea.  We prescribed some Zofran to help with nausea.  You can take Tylenol 1 g every 8 hours to help with any pain.  You can use Imodium over-the-counter to help with diarrhea.  At this time we have opted to hold off on CT imaging but if you develop pain in your right lower abdomen, or any other concerns please return to the ER for repeat evaluation and repeat abdominal exam
